# Patient Record
Sex: Male | Born: 1960 | Race: White | Hispanic: No | Marital: Married | Smoking: Former smoker
Health system: Southern US, Community
[De-identification: ages and names within clinical notes are randomized; demographics above are authoritative.]

## PROBLEM LIST (undated history)

## (undated) DIAGNOSIS — I10 Essential (primary) hypertension: Secondary | ICD-10-CM

## (undated) DIAGNOSIS — T7840XA Allergy, unspecified, initial encounter: Secondary | ICD-10-CM

## (undated) HISTORY — DX: Allergy, unspecified, initial encounter: T78.40XA

## (undated) HISTORY — DX: Essential (primary) hypertension: I10

---

## 2010-03-31 ENCOUNTER — Other Ambulatory Visit: Payer: Self-pay | Admitting: Orthopedic Surgery

## 2010-03-31 ENCOUNTER — Ambulatory Visit: Payer: BC Managed Care – PPO | Attending: Orthopedic Surgery | Admitting: Physical Therapy

## 2010-03-31 ENCOUNTER — Ambulatory Visit
Admission: RE | Admit: 2010-03-31 | Discharge: 2010-03-31 | Disposition: A | Discharge status: Home / Self Care | Payer: BC Managed Care – PPO | Source: Ambulatory Visit | Attending: Orthopedic Surgery | Admitting: Orthopedic Surgery

## 2010-03-31 DIAGNOSIS — M25561 Pain in right knee: Secondary | ICD-10-CM

## 2010-03-31 DIAGNOSIS — IMO0001 Reserved for inherently not codable concepts without codable children: Secondary | ICD-10-CM | POA: Insufficient documentation

## 2010-03-31 DIAGNOSIS — M25569 Pain in unspecified knee: Secondary | ICD-10-CM | POA: Insufficient documentation

## 2010-04-07 ENCOUNTER — Ambulatory Visit: Payer: BC Managed Care – PPO | Attending: Orthopedic Surgery | Admitting: Physical Therapy

## 2010-04-07 DIAGNOSIS — IMO0001 Reserved for inherently not codable concepts without codable children: Secondary | ICD-10-CM | POA: Insufficient documentation

## 2010-04-07 DIAGNOSIS — M25569 Pain in unspecified knee: Secondary | ICD-10-CM | POA: Insufficient documentation

## 2012-05-30 LAB — HM COLONOSCOPY

## 2014-08-19 DIAGNOSIS — L309 Dermatitis, unspecified: Secondary | ICD-10-CM | POA: Insufficient documentation

## 2017-05-09 DIAGNOSIS — H34231 Retinal artery branch occlusion, right eye: Secondary | ICD-10-CM | POA: Insufficient documentation

## 2019-12-25 DIAGNOSIS — R42 Dizziness and giddiness: Secondary | ICD-10-CM | POA: Insufficient documentation

## 2020-10-18 ENCOUNTER — Ambulatory Visit (INDEPENDENT_AMBULATORY_CARE_PROVIDER_SITE_OTHER): Payer: Managed Care, Other (non HMO) | Admitting: Sports Medicine

## 2020-10-18 DIAGNOSIS — M25562 Pain in left knee: Secondary | ICD-10-CM | POA: Diagnosis not present

## 2020-10-18 DIAGNOSIS — G8929 Other chronic pain: Secondary | ICD-10-CM | POA: Diagnosis not present

## 2020-10-18 MED ORDER — MELOXICAM 15 MG PO TABS: ORAL_TABLET | ORAL | 3 refills | Status: DC

## 2020-10-18 NOTE — Assessment & Plan Note (Signed)
This is a pleasant 60 year old male, he has a history of a chronic burn over his left leg, for months he has had discomfort in his left knee, anteromedial, mild gelling. 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 Return to see me if no better in 6 weeks and we can add x-rays and potentially injection.

## 2020-10-18 NOTE — Progress Notes (Signed)
    Procedures performed today:    None.  Independent interpretation of notes and tests performed by another provider:   None.  Brief History, Exam, Impression, and Recommendations:    Chronic pain of left knee This is a pleasant 61 year old male, he has a history of a chronic burn over his left leg, for months he has had discomfort in his left knee, anteromedial, mild gelling. 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 Return to see me if no better in 6 weeks and we can add x-rays and potentially injection.  Chronic process with exacerbation and pharmacologic intervention  ___________________________________________ [name], M.D., ABFM., CAQSM. Primary Care and Sports Medicine [facility]  Adjunct Instructor of Family Medicine  [employer]

## 2020-11-09 DIAGNOSIS — I10 Essential (primary) hypertension: Secondary | ICD-10-CM | POA: Insufficient documentation

## 2020-11-25 ENCOUNTER — Ambulatory Visit: Payer: Managed Care, Other (non HMO) | Admitting: Medical-Surgical

## 2020-11-25 ENCOUNTER — Encounter: Payer: Self-pay | Admitting: Medical-Surgical

## 2020-11-25 ENCOUNTER — Other Ambulatory Visit: Payer: Self-pay

## 2020-11-25 VITALS — BP 124/85 | HR 73 | Resp 20 | Ht 69.0 in | Wt 172.0 lb

## 2020-11-25 DIAGNOSIS — F1320 Sedative, hypnotic or anxiolytic dependence, uncomplicated: Secondary | ICD-10-CM

## 2020-11-25 DIAGNOSIS — R42 Dizziness and giddiness: Secondary | ICD-10-CM | POA: Diagnosis not present

## 2020-11-25 DIAGNOSIS — H8109 Meniere's disease, unspecified ear: Secondary | ICD-10-CM | POA: Diagnosis not present

## 2020-11-25 DIAGNOSIS — Z7689 Persons encountering health services in other specified circumstances: Secondary | ICD-10-CM | POA: Diagnosis not present

## 2020-11-25 DIAGNOSIS — F419 Anxiety disorder, unspecified: Secondary | ICD-10-CM | POA: Diagnosis not present

## 2020-11-25 DIAGNOSIS — Z23 Encounter for immunization: Secondary | ICD-10-CM | POA: Diagnosis not present

## 2020-11-25 DIAGNOSIS — I10 Essential (primary) hypertension: Secondary | ICD-10-CM

## 2020-11-25 NOTE — Progress Notes (Signed)
New Patient Office Visit  Subjective:  Patient ID: Scott Hernandez, male    DOB: 1960/01/19  Age: 60 y.o. MRN: [id number]  CC:  Chief Complaint  Patient presents with   Establish Care     HPI Scott Hernandez presents to establish care.  He is a very pleasant 61 year old gentleman who has relocated to [place] from [place] town.  He was previously seen by his PCP in [place] town but his provider has retired and moved out of state.  Today, he presents with no significant concerns.  He is currently being treated for hypertension with lisinopril 5 mg daily.  He checks his blood pressure at home with readings at goal.  He is having no side effects from the medication and has been taking it for approximately 2 months.  He exercises regularly and follows a low-sodium diet.  He does have a long history of having issues with dizziness that was suspected to be vertigo.  He was seen for this 20 to 25 years ago and evaluated by multiple providers with full testing.  Notes that when he goes into big open spaces or is exposed to bright lights, he has difficulty with dizziness onset.  Years ago after all the testing, he was advised to return to his family practitioner and discuss treatment with Valium for vertigo.  Notes that he went in to discuss the treatment with his PCP at the time who placed him on Xanax.  He has been taking Xanax 0.5 mg twice daily scheduled for the past 20 to 25 years and has had no further issues with his symptoms.  Recently, he did have his dosage increased in frequency and was provided an extra 10 tablets to use on a as needed basis.  Notes that he asked for this because some months due to the last 31 days rather than the prescription length of 30.  He is not currently taking extra doses during the day and reports that he ultimately would like to get off the medicine if he does not have to take it.  Past Medical History:  Diagnosis Date   Hypertension 9/22    History reviewed. No  pertinent surgical history.  History reviewed. No pertinent family history.  Social History   Socioeconomic History   Marital status: Married    Spouse name: Not on file   Number of children: Not on file   Years of education: Not on file   Highest education level: Not on file  Occupational History   Not on file  Tobacco Use   Smoking status: Former    Packs/day: 1.00    Years: 10.00    Pack years: 10.00    Types: Cigarettes    Quit date: 11/20/1995    Years since quitting: 25.0   Smokeless tobacco: Never  Vaping Use   Vaping Use: Never used  Substance and Sexual Activity   Alcohol use: Yes    Alcohol/week: 3.0 standard drinks    Types: 3 Cans of beer per week   Drug use: Never   Sexual activity: Yes    Birth control/protection: None  Other Topics Concern   Not on file  Social History Narrative   Not on file   Social Determinants of Health   Financial Resource Strain: Not on file  Food Insecurity: Not on file  Transportation Needs: Not on file  Physical Activity: Not on file  Stress: Not on file  Social Connections: Not on file  Intimate Partner Violence: Not on  file    ROS Review of Systems  Constitutional:  Negative for chills, fatigue, fever and unexpected weight change.  HENT:  Negative for congestion, rhinorrhea, sinus pressure and sore throat.   Eyes:  Negative for visual disturbance.  Respiratory:  Negative for cough, chest tightness, shortness of breath and wheezing.   Cardiovascular:  Negative for chest pain, palpitations and leg swelling.  Gastrointestinal:  Negative for abdominal pain, constipation, diarrhea, nausea and vomiting.  Genitourinary:  Negative for dysuria, frequency and urgency.  Skin:  Negative for rash.  Neurological:  Negative for dizziness, light-headedness and headaches.  Psychiatric/Behavioral:  Negative for dysphoric mood, self-injury, sleep disturbance and suicidal ideas. The patient is not nervous/anxious.    Objective:    Today's Vitals: BP 124/85 (BP Location: Right Arm, Patient Position: Sitting, Cuff Size: Large)   Pulse 73   Resp 20   Ht 5\' 9"  (1.753 m)   Wt 172 lb (78 kg)   SpO2 97%   BMI 25.40 kg/m   Physical Exam Vitals and nursing note reviewed.  Constitutional:      General: He is not in acute distress.    Appearance: Normal appearance. He is not ill-appearing.  HENT:     Head: Normocephalic and atraumatic.  Cardiovascular:     Rate and Rhythm: Normal rate and regular rhythm.     Pulses: Normal pulses.     Heart sounds: Normal heart sounds. No murmur heard.   No friction rub. No gallop.  Pulmonary:     Effort: Pulmonary effort is normal. No respiratory distress.     Breath sounds: Normal breath sounds.  Skin:    General: Skin is warm and dry.  Neurological:     Mental Status: He is alert and oriented to person, place, and time.  Psychiatric:        Mood and Affect: Mood normal.        Behavior: Behavior normal.        Thought Content: Thought content normal.        Judgment: Judgment normal.    Assessment & Plan:   1. Encounter to establish care Reviewed available information and discussed care concerns with patient.   2. Anxiety 3. Vertigo 4. Meniere's disease, unspecified laterality 5. Benzodiazepine dependence (HCC) Continue Xanax 0.5 mg twice daily for now.  We did discuss switching over to a longer acting benzodiazepine such as Valium but for now we will hold off.  We may discuss this again in the future and see how he tolerates switching.  In the meantime, if he would like to trial reducing his dose to one half of a tablet twice daily, I think this would be reasonable in the effort to be able to eventually discontinue the medication.  6. Essential hypertension Continue lisinopril 5 mg daily.  Blood pressure well controlled.  7. Need for shingles vaccine Shingrix No. 1 given in office today.  Second vaccine in series to be given in 2-6 months.  Okay to do this as a  nurse visit or at his next follow-up as desired. - Varicella-zoster vaccine IM (Shingrix)  Outpatient Encounter Medications as of 11/25/2020  Medication Sig   ALPRAZolam (XANAX) 0.5 MG tablet TAKE 1 TABLET BY MOUTH every 8 hours prn   Ascorbic Acid (VITAMIN C PO) Take by mouth daily.   Cyanocobalamin (B-12 PO) Take by mouth daily.   imiquimod (ALDARA) 5 % cream Apply topically.   lisinopril (ZESTRIL) 5 MG tablet Take 5 mg by mouth daily.  Multiple Vitamin (MULTI-VITAMIN) tablet Take 1 tablet by mouth daily.   VITAMIN D PO Take by mouth daily.   [DISCONTINUED] benzonatate (TESSALON) 200 MG capsule Take 200 mg by mouth 3 (three) times daily as needed.   [DISCONTINUED] meloxicam (MOBIC) 15 MG tablet One tab PO qAM with a meal for 2 weeks, then daily prn pain.   No facility-administered encounter medications on file as of 11/25/2020.    Follow-up: Return in about 3 months (around 02/25/2021) for vertigo follow up.   Clearnce Sorrel, DNP, APRN, FNP-BC [facility]

## 2020-12-27 ENCOUNTER — Telehealth: Payer: Self-pay | Admitting: General Practice

## 2020-12-27 NOTE — Telephone Encounter (Signed)
Transition Care Management Follow-up Telephone Call Date of discharge and from where: 12/26/20 from [facility] How have you been since you were released from the hospital? Pain is a little better.  Any questions or concerns? No  Items Reviewed: Did the pt receive and understand the discharge instructions provided? Yes  Medications obtained and verified? No  Other? No  Any new allergies since your discharge? No  Dietary orders reviewed? Yes Do you have support at home? Yes   Home Care and Equipment/Supplies: Were home health services ordered? no  Functional Questionnaire: (I = Independent and D = Dependent) ADLs: I  Bathing/Dressing- I  Meal Prep- I  Eating- I  Maintaining continence- I  Transferring/Ambulation- I  Managing Meds- I  Follow up appointments reviewed:  PCP Hospital f/u appt confirmed? Yes  Scheduled to see Scott Butter, NP on 12/26/20 @ 0810. Specialist Hospital f/u appt confirmed? No   Are transportation arrangements needed? No  If their condition worsens, is the pt aware to call PCP or go to the Emergency Dept.? Yes Was the patient provided with contact information for the PCP's office or ED? Yes Was to pt encouraged to call back with questions or concerns? Yes

## 2021-01-05 ENCOUNTER — Encounter: Payer: Self-pay | Admitting: Medical-Surgical

## 2021-01-05 ENCOUNTER — Other Ambulatory Visit: Payer: Self-pay | Admitting: Medical-Surgical

## 2021-01-05 MED ORDER — ALPRAZOLAM 0.5 MG PO TABS: ORAL_TABLET | ORAL | 2 refills | Status: DC

## 2021-01-27 NOTE — Progress Notes (Signed)
HPI with pertinent ROS:   CC: Hospital discharge follow-up  HPI: Very pleasant 61 year old male presenting today for hospital discharge follow-up after he presented to the ED left-sided neck pain that radiated down the left arm.  He had work-up in the ED which was benign and was determined to have cervical degenerative disc concerns.  The pain lasted for another 2 days and then spontaneously resolved.  Today he presents feeling fine and completely symptom-free.  Has had no numbness, tingling, temperature or color changes to the upper extremities.  No chest pains, shortness of breath, nausea, vomiting, or diaphoresis.  Tolerating regular activities and intense exercise without dyspnea or chest pain.  Has continued to have left knee issues.  He did see Dr. Karie Schwalbe in October regarding the chronic pain in his left knee.  At that time, he was offered to have an x-ray completed but he decided against it.  Now, he has tried conservative therapy and regular exercise but his left knee continues to bother him.  Notes that activity and intense exercise makes it better while inactivity makes it worse.  Does have pain in various areas of the knee when walking uphill or downhill.  Would like to have x-rays done today.  I reviewed the past medical history, family history, social history, surgical history, and allergies today and no changes were needed.  Please see the problem list section below in epic for further details.   Physical exam:   Physical Exam Vitals and nursing note reviewed.  Constitutional:      General: He is not in acute distress.    Appearance: Normal appearance. He is normal weight. He is not ill-appearing.  HENT:     Head: Normocephalic and atraumatic.  Cardiovascular:     Rate and Rhythm: Normal rate and regular rhythm.     Pulses: Normal pulses.     Heart sounds: Normal heart sounds. No murmur heard.   No friction rub. No gallop.  Pulmonary:     Effort: Pulmonary effort is normal. No  respiratory distress.     Breath sounds: Normal breath sounds.  Skin:    General: Skin is warm and dry.  Neurological:     Mental Status: He is alert and oriented to person, place, and time.  Psychiatric:        Mood and Affect: Mood normal.        Behavior: Behavior normal.        Thought Content: Thought content normal.        Judgment: Judgment normal.    Impression and Recommendations:    1. Hospital discharge follow-up Reviewed available information and discussed hospital visit with patient.  Patient education provided on cervical degeneration and measures to take to prevent worsening of his symptoms and to address rehabilitation of the cervical spine.  Printed prescriptions provided for cervical spondylosis with instructions to perform these at home.  Okay to use over-the-counter ibuprofen or other anti-inflammatory as needed.  2. Chronic pain of left knee We will go ahead and get x-rays of the left knee today.  Popping and clicking of the knee is concerning for possible meniscal tear however he has responded beautifully to regular intense activity.  Would recommend continuing to exercise.  Can consider adding an oral anti-inflammatory or using a topical 1 such as Voltaren gel. - DG Knee Complete 4 Views Left; Future  3. Need for shingles vaccine Shingrix No. 2 given in office today.  Series complete. - Varicella-zoster vaccine IM (Shingrix)  Return in about 6 months (around 07/28/2021) for HTN follow up. ___________________________________________ Thayer Ohm, DNP, APRN, FNP-BC Primary Care and Sports Medicine [facility]

## 2021-01-28 ENCOUNTER — Ambulatory Visit: Payer: Managed Care, Other (non HMO) | Admitting: Medical-Surgical

## 2021-01-28 ENCOUNTER — Encounter: Payer: Self-pay | Admitting: Medical-Surgical

## 2021-01-28 ENCOUNTER — Ambulatory Visit (INDEPENDENT_AMBULATORY_CARE_PROVIDER_SITE_OTHER): Payer: Managed Care, Other (non HMO)

## 2021-01-28 ENCOUNTER — Other Ambulatory Visit: Payer: Self-pay

## 2021-01-28 VITALS — BP 113/81 | HR 77 | Resp 20 | Ht 69.0 in | Wt 173.0 lb

## 2021-01-28 DIAGNOSIS — M25562 Pain in left knee: Secondary | ICD-10-CM

## 2021-01-28 DIAGNOSIS — G8929 Other chronic pain: Secondary | ICD-10-CM

## 2021-01-28 DIAGNOSIS — Z23 Encounter for immunization: Secondary | ICD-10-CM | POA: Diagnosis not present

## 2021-01-28 DIAGNOSIS — Z09 Encounter for follow-up examination after completed treatment for conditions other than malignant neoplasm: Secondary | ICD-10-CM

## 2021-02-25 ENCOUNTER — Ambulatory Visit: Payer: Managed Care, Other (non HMO) | Admitting: Medical-Surgical

## 2021-04-07 ENCOUNTER — Other Ambulatory Visit: Payer: Self-pay | Admitting: Medical-Surgical

## 2021-06-15 ENCOUNTER — Encounter: Payer: Self-pay | Admitting: Medical-Surgical

## 2021-06-15 DIAGNOSIS — G8929 Other chronic pain: Secondary | ICD-10-CM

## 2021-06-21 ENCOUNTER — Ambulatory Visit: Payer: Managed Care, Other (non HMO) | Attending: Medical-Surgical | Admitting: Physical Therapy

## 2021-06-21 DIAGNOSIS — R2689 Other abnormalities of gait and mobility: Secondary | ICD-10-CM | POA: Diagnosis present

## 2021-06-21 DIAGNOSIS — M25562 Pain in left knee: Secondary | ICD-10-CM | POA: Insufficient documentation

## 2021-06-21 DIAGNOSIS — G8929 Other chronic pain: Secondary | ICD-10-CM | POA: Insufficient documentation

## 2021-06-21 DIAGNOSIS — M6281 Muscle weakness (generalized): Secondary | ICD-10-CM | POA: Insufficient documentation

## 2021-06-22 NOTE — Patient Instructions (Signed)
Access Code: [email or web address] URL: [email or web address] Date: 06/22/2021 Prepared by: Vernon Prey April Kirstie Peri  Exercises - Clamshell with Resistance  - 1 x daily - 7 x weekly - 3 sets - 10 reps - Sidelying Reverse Clamshell with Resistance  - 1 x daily - 7 x weekly - 3 sets - 10 reps - Forward Step Down  - 1 x daily - 7 x weekly - 3 sets - 10 reps - Forward T Arms Overhead with Band  - 1 x daily - 7 x weekly - 3 sets - 10 reps

## 2021-06-22 NOTE — Therapy (Signed)
[facility] [address] [place], [place], [address] Phone: [phone]   Fax:  [phone]  Physical Therapy Evaluation  Patient Details  Name: Scott BailRicky Hernandez MRN: [id number] Date of Birth: 12/09/1960 Referring Provider (PT): Christen ButterJessup, Joy, NP  Rationale for Evaluation and Treatment Rehabilitation  Encounter Date: 06/21/2021   PT End of Session - 06/21/21 1520     Visit Number 1    Number of Visits 6    Date for PT Re-Evaluation 08/02/21    Authorization Type [employer] Managed    PT Start Time 1520    PT Stop Time 1600    PT Time Calculation (min) 40 min    Activity Tolerance Patient tolerated treatment well    Behavior During Therapy [email or web address] for tasks assessed/performed             Past Medical History:  Diagnosis Date   Hypertension 9/22    No past surgical history on file.  There were no vitals filed for this visit.    Subjective Assessment - 06/21/21 1525     Subjective Pt reports 3 months ago his shoulder started bothering him and his knee also starting to get worked up. Pt states he is waiting for x-ray on his R shoulder and has not discussed with MD. Pt states L knee pain began ~6 months ago but is unable to note a method of injury. Pt reports he would rest and then L knee would feel okay and then flare up again. It is currently improved. Pt states he can feel it when bending the knee. Can feel it come on if pushing it hard on squats. Will mostly feel it in the back of his knee but when he lays on his side he feels it in his medial knee.    How long can you sit comfortably? n/a    How long can you stand comfortably? n/a    How long can you walk comfortably? n/a    Currently in Pain? Yes    Pain Score 1    at worst 4/10   Pain Location Knee    Pain Orientation Left    Pain Type Chronic pain    Pain Onset More than a month ago    Pain Frequency Intermittent                [facility] PT Assessment -  06/22/21 0001       Assessment   Medical Diagnosis M25.562,G89.29 (ICD-10-CM) - Chronic pain of left knee    Referring Provider (PT) Christen ButterJessup, Joy, NP    Onset Date/Surgical Date --   ~6 months ago   Hand Dominance Right    Prior Therapy Yes, for right knee      Precautions   Precautions None      Restrictions   Weight Bearing Restrictions No      Balance Screen   Has the patient fallen in the past 6 months No      Home Environment   Living Environment Private residence    Living Arrangements Spouse/significant other      Prior Function   Vocation Full time employment    Vocation Requirements Mostly desk work    Leisure Works out, jogs      Observation/Other Assessments   Focus on Therapeutic Outcomes (FOTO)  n/a      Functional Tests   Functional tests Single leg stance;Sit to Stand      Single Leg Stance   Comments R  LE: 1 min 30 sec; L LE: 1 min 30 sec      Sit to Stand   Comments SL on L increased instability with medial/lateral shifting vs R      AROM   Right Knee Extension 0    Right Knee Flexion 145    Left Knee Extension 0    Left Knee Flexion 145      Strength   Right Hip Flexion 5/5    Right Hip Extension 5/5    Right Hip External Rotation  5/5    Right Hip Internal Rotation 5/5    Right Hip ABduction 5/5    Right Hip ADduction 5/5    Left Hip Flexion 4+/5    Left Hip Extension 4/5    Left Hip External Rotation 4-/5    Left Hip Internal Rotation 4+/5    Left Hip ABduction 4/5    Left Hip ADduction 5/5    Right Knee Flexion 5/5    Right Knee Extension 5/5    Left Knee Flexion 5/5    Left Knee Extension 5/5                        Objective measurements completed on examination: See above findings.                PT Education - 06/21/21 1607     Education Details Discussed exam findings, POC and HEP    Person(s) Educated Patient    Methods Explanation;Demonstration;Tactile cues;Verbal cues;Handout     Comprehension Verbalized understanding;Returned demonstration;Verbal cues required;Tactile cues required                 PT Long Term Goals - 06/22/21 0717       PT LONG TERM GOAL #1   Title Pt will be independent with HEP    Time 6    Period Weeks    Status New    Target Date 08/03/21      PT LONG TERM GOAL #2   Title Pt will report >/=50% decrease in knee pain for all activities    Time 6    Period Weeks    Status New    Target Date 08/03/21      PT LONG TERM GOAL #3   Title Pt will be able to perform single leg squat without pain    Time 6    Period Weeks    Status New    Target Date 08/03/21      PT LONG TERM GOAL #4   Title Pt will have L=R LE strength    Time 6    Period Weeks    Status New    Target Date 08/03/21                    Plan - 06/21/21 1608     Clinical Impression Statement Mr. Scott Hernandez is a 61 y/o M presenting to OPPT due to ongoing L knee pain x 6 months. Pain has been decreasing overall; however, pt notes that certain movements can cause it to flare up with pain mostly in the posterior and medial knee. PT assessment significant for L>R knee instability with single leg movements. Static balance is [facility] however dynamic single leg motions demo increased medial/lateral shifting. Pain reproduced with knee bent and valgus force (i.e. turning to R sidelying pushing through L foot). Pt would benefit from PT to address these issues for pain free mobility.  Personal Factors and Comorbidities Fitness;Time since onset of injury/illness/exacerbation    Examination-Activity Limitations Squat;Stairs    Examination-Participation Restrictions Community Activity;Yard Work    Stability/Clinical Decision Making Stable/Uncomplicated    Clinical Decision Making Low    Rehab Potential Good    PT Frequency 1x / week    PT Duration 6 weeks    PT Treatment/Interventions ADLs/Self Care Home Management;Cryotherapy;Electrical Stimulation;Iontophoresis  4mg /ml Dexamethasone;Moist Heat;DME Instruction;Gait training;Stair training;Functional mobility training;Therapeutic activities;Therapeutic exercise;Balance training;Neuromuscular re-education;Passive range of motion;Dry needling;Taping    PT Next Visit Plan Assess response to HEP. Continue to work on single leg stability and gross knee/hip strengthening    PT Home Exercise Plan Access Code: [email or web address]    Consulted and Agree with Plan of Care Patient             Patient will benefit from skilled therapeutic intervention in order to improve the following deficits and impairments:  Pain, Hypermobility, Decreased balance, Improper body mechanics, Decreased mobility, Decreased strength  Visit Diagnosis: Chronic pain of left knee  Muscle weakness (generalized)  Other abnormalities of gait and mobility     Problem List Patient Active Problem List   Diagnosis Date Noted   Anxiety 11/25/2020   Need for shingles vaccine 11/25/2020   Essential hypertension 11/09/2020   Vertigo 12/25/2019   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 [place], [place], [place] Phone: [phone]   Fax:  [phone]  Name: Lestat Golob MRN: Scott Hernandez Date of Birth: 11-19-1960

## 2021-06-29 ENCOUNTER — Ambulatory Visit: Payer: Managed Care, Other (non HMO) | Admitting: Physical Therapy

## 2021-07-08 ENCOUNTER — Other Ambulatory Visit: Payer: Self-pay | Admitting: Medical-Surgical

## 2021-07-09 ENCOUNTER — Encounter: Payer: Self-pay | Admitting: Medical-Surgical

## 2021-07-11 NOTE — Telephone Encounter (Signed)
Appointment scheduled for 7/20 so he will need to attend this for any further refills.

## 2021-07-28 ENCOUNTER — Encounter: Payer: Self-pay | Admitting: Medical-Surgical

## 2021-07-28 ENCOUNTER — Ambulatory Visit: Payer: Managed Care, Other (non HMO) | Admitting: Medical-Surgical

## 2021-07-28 VITALS — BP 115/76 | HR 60 | Resp 20 | Ht 69.0 in | Wt 169.1 lb

## 2021-07-28 DIAGNOSIS — F1320 Sedative, hypnotic or anxiolytic dependence, uncomplicated: Secondary | ICD-10-CM

## 2021-07-28 DIAGNOSIS — I10 Essential (primary) hypertension: Secondary | ICD-10-CM | POA: Diagnosis not present

## 2021-07-28 DIAGNOSIS — R053 Chronic cough: Secondary | ICD-10-CM

## 2021-07-28 MED ORDER — ALPRAZOLAM 0.5 MG PO TABS
0.5000 mg | ORAL_TABLET | Freq: Two times a day (BID) | ORAL | 5 refills | Status: DC | PRN
Start: 2021-08-10 — End: 2022-02-02

## 2021-07-28 MED ORDER — LISINOPRIL 5 MG PO TABS: 5.0000 mg | ORAL_TABLET | Freq: Every day | ORAL | 1 refills | Status: DC

## 2021-07-28 NOTE — Progress Notes (Signed)
Established Patient Office Visit  Subjective   Patient ID: Scott Hernandez, male   DOB: 1960-06-21 Age: 61 y.o. MRN: [id number]   Chief Complaint  Patient presents with   Hypertension    HPI Pleasant 61 year old male presenting today for follow up on HTN.  Medication: Lisinopril 5mg  daily Compliant: yes Side effects: none Checking BP at home: yes, readings 110-120/70-80 Low sodium diet: yes Exercise: some, limited by shoulder pain, getting better Concerning symptoms: none Found that certain foods and drinks can affect his BP. Avoiding caffeine and energy drinks.  Requesting a refill on Xanax. Has been treated with this long term. Was started on it for Meniere's disease although he notes that they originally talked about Valium instead. Since then, he has been taking 0.5mg  BID scheduled. Recently, has noted some "brain fog" in the afternoons. Over the past couple of days, he has taken a full tablet in the morning with a half tab in the afternoon and another half tab at bedtime. Reports this has resolved the brain fog and he feels normal. Has not tried to wean off of Xanax or only take it on an as needed basis.   Over the past several weeks, has noted a dry cough that happens once a day, every day. Notes that he will cough 8-9 times then be fine for the rest of the day. This happens at various times of the day and is not associated with particular activities. Does note waking with a bad taste in his mouth some mornings but has no other reflux symptoms. Has tried homeopathic allergy remedies without benefit.    Objective:    Vitals:   07/28/21 1514  BP: 115/76  Pulse: 60  Resp: 20  Height: 5\' 9"  (1.753 m)  Weight: 169 lb 1.9 oz (76.7 kg)  SpO2: 97%  BMI (Calculated): 24.96   Physical Exam Vitals reviewed.  Constitutional:      General: He is not in acute distress.    Appearance: Normal appearance. He is obese. He is not ill-appearing.  HENT:     Head: Normocephalic.   Cardiovascular:     Rate and Rhythm: Normal rate.     Pulses: Normal pulses.     Heart sounds: Normal heart sounds. No murmur heard.    No friction rub. No gallop.  Pulmonary:     Effort: Pulmonary effort is normal. No respiratory distress.     Breath sounds: Normal breath sounds.  Skin:    General: Skin is warm and dry.  Neurological:     Mental Status: He is alert and oriented to person, place, and time.  Psychiatric:        Mood and Affect: Mood normal.        Behavior: Behavior normal.        Thought Content: Thought content normal.        Judgment: Judgment normal.   No results found for this or any previous visit (from the past 24 hour(s)).     The ASCVD Risk score (Arnett DK, et al., 2019) failed to calculate for the following reasons:   Cannot find a previous HDL lab   Assessment & Plan:   1. Essential hypertension BP well controlled. Continue Lisinopril 5mg  daily. Continue to monitor BP at home with a goal of 130/80 or less. Recommend low sodium diet, regular intentional exercise, and maintaining a healthy weight.   2. Benzodiazepine dependence (HCC) Refilling Xanax 0.5mg  BID. Discussed long term treatment and risks associated with dependence/tolerance. Consider  reducing dose to 1/2 tablet TID to see how he responds. Would like him to work on reducing use to the smallest effective dose on a prn basis.   3. Chronic cough Unclear etiology. Consider allergies, LPR, or ACE cough. Recommend a daily antihistamine. Also recommend famotidine 20mg  BID to see if this provides any relief. If these are not helpful, may need to switch to a different BP med. Patient verbalized understanding.   Return in about 4 months (around 11/28/2021) for annual physical exam.  ___________________________________________ 11/30/2021, DNP, APRN, FNP-BC Primary Care and Sports Medicine [facility] [place]

## 2021-08-18 ENCOUNTER — Encounter: Payer: Self-pay | Admitting: Medical-Surgical

## 2021-09-20 ENCOUNTER — Telehealth: Payer: Self-pay | Admitting: Medical-Surgical

## 2021-09-20 NOTE — Telephone Encounter (Signed)
Pt called.  He wants to go ahead with getting the xray of his shoulder.

## 2021-09-27 ENCOUNTER — Ambulatory Visit: Payer: Managed Care, Other (non HMO) | Admitting: Sports Medicine

## 2021-09-27 ENCOUNTER — Ambulatory Visit: Payer: Managed Care, Other (non HMO)

## 2021-09-27 DIAGNOSIS — M75111 Incomplete rotator cuff tear or rupture of right shoulder, not specified as traumatic: Secondary | ICD-10-CM | POA: Insufficient documentation

## 2021-09-27 DIAGNOSIS — G8929 Other chronic pain: Secondary | ICD-10-CM

## 2021-09-27 DIAGNOSIS — M25511 Pain in right shoulder: Secondary | ICD-10-CM | POA: Diagnosis not present

## 2021-09-27 NOTE — Assessment & Plan Note (Signed)
Scott Hernandez is a very pleasant 61 year old male, he has had several months of pain at right shoulder localized anteriorly and over the deltoid, worse with jerking motions. He has done over 2 months of rotator cuff and band type strengthening exercises so I would consider him to have failed physician directed conservative treatment for over 6 weeks. On exam he does have mostly labral signs with a positive O'Brien's test. Considering failure of conservative treatment we will proceed with x-rays and an MR arthrogram, I will see him back for the arthrogram injection.

## 2021-09-27 NOTE — Progress Notes (Signed)
    Procedures performed today:    None.  Independent interpretation of notes and tests performed by another provider:   None.  Brief History, Exam, Impression, and Recommendations:    Chronic right shoulder pain Scott Hernandez is a very pleasant 61 year old male, he has had several months of pain at right shoulder localized anteriorly and over the deltoid, worse with jerking motions. He has done over 2 months of rotator cuff and band type strengthening exercises so I would consider him to have failed physician directed conservative treatment for over 6 weeks. On exam he does have mostly labral signs with a positive O'Brien's test. Considering failure of conservative treatment we will proceed with x-rays and an MR arthrogram, I will see him back for the arthrogram injection.    ____________________________________________ Gwen Her. Dianah Field, M.D., ABFM., CAQSM., AME. Primary Care and Sports Medicine [place] [facility]  Adjunct Professor of [place] [employer] [facility] of Medicine  Risk manager

## 2021-10-17 ENCOUNTER — Ambulatory Visit: Payer: Managed Care, Other (non HMO)

## 2021-10-17 ENCOUNTER — Encounter: Payer: Self-pay | Admitting: Sports Medicine

## 2021-10-17 ENCOUNTER — Ambulatory Visit: Payer: Managed Care, Other (non HMO) | Admitting: Sports Medicine

## 2021-10-17 ENCOUNTER — Ambulatory Visit (INDEPENDENT_AMBULATORY_CARE_PROVIDER_SITE_OTHER): Payer: Managed Care, Other (non HMO)

## 2021-10-17 DIAGNOSIS — G8929 Other chronic pain: Secondary | ICD-10-CM

## 2021-10-17 DIAGNOSIS — M25511 Pain in right shoulder: Secondary | ICD-10-CM

## 2021-10-17 NOTE — Progress Notes (Signed)
    Procedures performed today:    Procedure: Real-time Ultrasound Guided gadolinium contrast injection of right glenohumeral joint Device: Samsung HS60  Verbal informed consent obtained.  Time-out conducted.  Noted no overlying erythema, induration, or other signs of local infection.  Skin prepped in a sterile fashion.  Local anesthesia: Topical Ethyl chloride.  With sterile technique and under real time ultrasound guidance: Noted arthritic joint, 22-gauge spinal needle advanced into the joint from a posterior approach, I injected 1 cc kenalog 40, 2 cc lidocaine, 2 cc bupivacaine, syringe switched and 0.1 cc gadolinium injected, syringe again switched and 10 cc sterile saline used to fully distend the joint. Joint visualized and capsule seen distending confirming intra-articular placement of contrast material and medication. Completed without difficulty  Advised to call if fevers/chills, erythema, induration, drainage, or persistent bleeding.  Images permanently stored in PACS Impression: Technically successful ultrasound guided gadolinium contrast injection for MR arthrography.  Please see separate MR arthrogram report.  Independent interpretation of notes and tests performed by another provider:   None.  Brief History, Exam, Impression, and Recommendations:    Chronic right shoulder pain Please see prior notes, injection for MR arthrography as above.    ____________________________________________ Gwen Her. Dianah Field, M.D., ABFM., CAQSM., AME. Primary Care and Sports Medicine [place] [facility]  Adjunct Professor of [employer] [employer] of Medicine  Risk manager

## 2021-10-17 NOTE — Assessment & Plan Note (Signed)
Please see prior notes, injection for MR arthrography as above.

## 2021-10-23 NOTE — Progress Notes (Unsigned)
   Complete physical exam  Patient: Scott Hernandez   DOB: 1960/10/06   61 y.o. Male  MRN: [id number]  Subjective:    No chief complaint on file.   Sanchez Hemmer is a 61 y.o. male who presents today for a complete physical exam. He reports consuming a {diet types:17450} diet. {types:19826} He generally feels {DESC; WELL/FAIRLY WELL/POORLY:18703}. He reports sleeping {DESC; WELL/FAIRLY WELL/POORLY:18703}. He {does/does not:200015} have additional problems to discuss today.    Most recent fall risk assessment:    07/28/2021    3:17 PM  [place] in the past year? 0  Number falls in past yr: 0  Injury with Fall? 0  Risk for fall due to : No Fall Risks     Most recent depression screenings:    07/28/2021    3:18 PM 11/25/2020    9:41 AM  PHQ 2/9 Scores  PHQ - 2 Score 0 0    {VISON DENTAL STD PSA (Optional):27386}  {History (Optional):23778}  Patient Care Team: Samuel Bouche, NP as PCP - General (Nurse Practitioner)   Outpatient Medications Prior to Visit  Medication Sig   ALPRAZolam (XANAX) 0.5 MG tablet Take 1 tablet (0.5 mg total) by mouth 2 (two) times daily as needed for anxiety.   Ascorbic Acid (VITAMIN C PO) Take by mouth daily.   Cyanocobalamin (B-12 PO) Take by mouth daily.   imiquimod (ALDARA) 5 % cream Apply topically.   lisinopril (ZESTRIL) 5 MG tablet Take 1 tablet (5 mg total) by mouth daily.   Multiple Vitamin (MULTI-VITAMIN) tablet Take 1 tablet by mouth daily.   VITAMIN D PO Take by mouth daily.   No facility-administered medications prior to visit.    ROS        Objective:     There were no vitals taken for this visit. {Vitals History (Optional):23777}  Physical Exam   No results found for any visits on 10/24/21. {Show previous labs (optional):23779}    Assessment & Plan:    Routine Health Maintenance and Physical Exam  Immunization History  Administered Date(s) Administered   Janssen (J&J) SARS-COV-2 Vaccination 05/15/2019    Tdap 11/26/2014   Zoster Recombinat (Shingrix) 11/25/2020, 01/28/2021    Health Maintenance  Topic Date Due   HIV Screening  Never done   Hepatitis C Screening  Never done   INFLUENZA VACCINE  Never done   COLONOSCOPY (Pts 45-80yrs Insurance coverage will need to be confirmed)  05/31/2022   TETANUS/TDAP  11/25/2024   Zoster Vaccines- Shingrix  Completed   HPV VACCINES  Aged Out   COVID-19 Vaccine  Discontinued    Discussed health benefits of physical activity, and encouraged him to engage in regular exercise appropriate for his age and condition.  Problem List Items Addressed This Visit       Cardiovascular and Mediastinum   Essential hypertension - Primary     Other   Anxiety   Other Visit Diagnoses     Annual physical exam       Need for influenza vaccination          No follow-ups on file.     Samuel Bouche, NP

## 2021-10-24 ENCOUNTER — Encounter: Payer: Self-pay | Admitting: Medical-Surgical

## 2021-10-24 ENCOUNTER — Ambulatory Visit (INDEPENDENT_AMBULATORY_CARE_PROVIDER_SITE_OTHER): Payer: Managed Care, Other (non HMO) | Admitting: Medical-Surgical

## 2021-10-24 VITALS — BP 116/79 | HR 71 | Ht 70.0 in | Wt 164.0 lb

## 2021-10-24 DIAGNOSIS — Z0000 Encounter for general adult medical examination without abnormal findings: Secondary | ICD-10-CM | POA: Diagnosis not present

## 2021-10-24 DIAGNOSIS — I10 Essential (primary) hypertension: Secondary | ICD-10-CM | POA: Diagnosis not present

## 2021-10-24 DIAGNOSIS — Z125 Encounter for screening for malignant neoplasm of prostate: Secondary | ICD-10-CM

## 2021-10-24 DIAGNOSIS — F419 Anxiety disorder, unspecified: Secondary | ICD-10-CM | POA: Diagnosis not present

## 2021-10-24 DIAGNOSIS — R5383 Other fatigue: Secondary | ICD-10-CM

## 2021-10-24 DIAGNOSIS — Z23 Encounter for immunization: Secondary | ICD-10-CM

## 2021-10-25 ENCOUNTER — Encounter: Payer: Self-pay | Admitting: Medical-Surgical

## 2021-10-25 DIAGNOSIS — E785 Hyperlipidemia, unspecified: Secondary | ICD-10-CM

## 2021-10-25 LAB — LIPID PANEL
Cholesterol: 215 mg/dL — ABNORMAL HIGH (ref <200)
HDL: 46 mg/dL (ref >=40)
LDL Cholesterol (Calc): 138 mg/dL (calc) — ABNORMAL HIGH
Non-HDL Cholesterol (Calc): 169 mg/dL (calc) — ABNORMAL HIGH (ref <130)
Total CHOL/HDL Ratio: 4.7 (calc) (ref <5.0)
Triglycerides: 168 mg/dL — ABNORMAL HIGH (ref <150)

## 2021-10-25 LAB — CBC WITH DIFFERENTIAL/PLATELET
Absolute Monocytes: 693 cells/uL (ref 200–950)
Basophils Absolute: 27 cells/uL (ref 0–200)
Basophils Relative: 0.3 %
Eosinophils Absolute: 72 cells/uL (ref 15–500)
Eosinophils Relative: 0.8 %
HCT: 52 % — ABNORMAL HIGH (ref 38.5–50.0)
Hemoglobin: 18 g/dL — ABNORMAL HIGH (ref 13.2–17.1)
Lymphs Abs: 1449 cells/uL (ref 850–3900)
MCH: 29 pg (ref 27.0–33.0)
MCHC: 34.6 g/dL (ref 32.0–36.0)
MCV: 83.7 fL (ref 80.0–100.0)
MPV: 10.2 fL (ref 7.5–12.5)
Monocytes Relative: 7.7 %
Neutro Abs: 6759 cells/uL (ref 1500–7800)
Neutrophils Relative %: 75.1 %
Platelets: 286 10*3/uL (ref 140–400)
RBC: 6.21 10*6/uL — ABNORMAL HIGH (ref 4.20–5.80)
RDW: 13.3 % (ref 11.0–15.0)
Total Lymphocyte: 16.1 %
WBC: 9 10*3/uL (ref 3.8–10.8)

## 2021-10-25 LAB — COMPLETE METABOLIC PANEL WITH GFR
AG Ratio: 1.7 (calc) (ref 1.0–2.5)
ALT: 25 U/L (ref 9–46)
AST: 19 U/L (ref 10–35)
Albumin: 4.9 g/dL (ref 3.6–5.1)
Alkaline phosphatase (APISO): 63 U/L (ref 35–144)
BUN/Creatinine Ratio: 31 (calc) — ABNORMAL HIGH (ref 6–22)
BUN: 33 mg/dL — ABNORMAL HIGH (ref 7–25)
CO2: 27 mmol/L (ref 20–32)
Calcium: 9.9 mg/dL (ref 8.6–10.3)
Chloride: 98 mmol/L (ref 98–110)
Creat: 1.08 mg/dL (ref 0.70–1.35)
Globulin: 2.9 g/dL (calc) (ref 1.9–3.7)
Glucose, Bld: 78 mg/dL (ref 65–139)
Potassium: 4.6 mmol/L (ref 3.5–5.3)
Sodium: 133 mmol/L — ABNORMAL LOW (ref 135–146)
Total Bilirubin: 0.7 mg/dL (ref 0.2–1.2)
Total Protein: 7.8 g/dL (ref 6.1–8.1)
eGFR: 79 mL/min/{1.73_m2} (ref >=60)

## 2021-10-25 LAB — TESTOSTERONE TOTAL,FREE,BIO, MALES
Albumin: 4.9 g/dL (ref 3.6–5.1)
Sex Hormone Binding: 49 nmol/L (ref 22–77)
Testosterone, Bioavailable: 84.8 ng/dL — ABNORMAL LOW (ref 110.0–575.0)
Testosterone, Free: 38 pg/mL — ABNORMAL LOW (ref 46.0–224.0)
Testosterone: 414 ng/dL (ref 250–827)

## 2021-10-25 LAB — PSA: PSA: 0.84 ng/mL (ref <=4.00)

## 2021-10-25 MED ORDER — ROSUVASTATIN CALCIUM 5 MG PO TABS: 5.0000 mg | ORAL_TABLET | Freq: Every day | ORAL | 3 refills | Status: DC

## 2021-12-06 ENCOUNTER — Ambulatory Visit: Payer: Managed Care, Other (non HMO) | Admitting: Sports Medicine

## 2021-12-06 DIAGNOSIS — M75111 Incomplete rotator cuff tear or rupture of right shoulder, not specified as traumatic: Secondary | ICD-10-CM

## 2021-12-06 NOTE — Assessment & Plan Note (Signed)
This is a very pleasant 61 year old male, chronic right shoulder pain, impingement in nature, we did a MR arthrography that showed mostly partial-thickness rotator cuff tearing of the supraspinatus, he felt pretty good after the injection and then symptoms came back, he is agreeable to try some physical therapy for 6 to 8 weeks before considering referral for arthroscopic intervention and potential rotator cuff repair.

## 2021-12-06 NOTE — Progress Notes (Signed)
    Procedures performed today:    None.  Independent interpretation of notes and tests performed by another provider:   None.  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    ____________________________________________ [name], M.D., ABFM., CAQSM., AME. Primary Care and Sports Medicine  [facility]  Adjunct Professor of Family Medicine  [employer] [employer] [facility] of Medicine  Restaurant manager, fast food

## 2021-12-14 ENCOUNTER — Encounter: Payer: Self-pay | Admitting: Rehabilitative and Restorative Service Providers"

## 2021-12-14 ENCOUNTER — Other Ambulatory Visit: Payer: Self-pay

## 2021-12-14 ENCOUNTER — Ambulatory Visit
Payer: Managed Care, Other (non HMO) | Attending: Sports Medicine | Admitting: Rehabilitative and Restorative Service Providers"

## 2021-12-14 DIAGNOSIS — M25511 Pain in right shoulder: Secondary | ICD-10-CM | POA: Insufficient documentation

## 2021-12-14 DIAGNOSIS — M75111 Incomplete rotator cuff tear or rupture of right shoulder, not specified as traumatic: Secondary | ICD-10-CM | POA: Insufficient documentation

## 2021-12-14 DIAGNOSIS — R531 Weakness: Secondary | ICD-10-CM | POA: Diagnosis not present

## 2021-12-14 DIAGNOSIS — R293 Abnormal posture: Secondary | ICD-10-CM | POA: Diagnosis not present

## 2021-12-14 DIAGNOSIS — G8929 Other chronic pain: Secondary | ICD-10-CM

## 2021-12-14 DIAGNOSIS — R29898 Other symptoms and signs involving the musculoskeletal system: Secondary | ICD-10-CM

## 2021-12-14 NOTE — Therapy (Signed)
OUTPATIENT PHYSICAL THERAPY SHOULDER EVALUATION   Patient Name: Scott Hernandez MRN: [id number] DOB:December 16, 1960, 61 y.o., male Today's Date: 12/14/2021  END OF SESSION:  PT End of Session - 12/14/21 1552     Visit Number 1    Number of Visits 16    Date for PT Re-Evaluation 02/08/22    PT Start Time 1400    PT Stop Time 1448    PT Time Calculation (min) 48 min    Activity Tolerance Patient tolerated treatment well             Past Medical History:  Diagnosis Date   Hypertension 9/22   History reviewed. No pertinent surgical history. 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   Ganglion cyst 07/28/2011    PCP: Christen Butter, NP  REFERRING PROVIDER: Dr Rodney Langton  REFERRING DIAG: Partial nontraumatic tear Rt RC  THERAPY DIAG:  Chronic right shoulder pain  Abnormal posture  Weakness generalized  Other symptoms and signs involving the musculoskeletal system  Rationale for Evaluation and Treatment: Rehabilitation  ONSET DATE: 12/09/20  SUBJECTIVE:                                                                                                                                                                                      SUBJECTIVE STATEMENT: Patient reports that he has had Rt shoulder pain for the past year with no known injury. He has not had treatment other than doing some exercises that he found on line. Exercises have not helped. MRI showed partial tear of Rt supraspinatus.   PERTINENT HISTORY: Knee pain treated successfully with PT and HEP; HTN  PAIN:  Are you having pain? Yes: NPRS scale: 1/10 Pain location: top of shoulder  Pain description: dull   Aggravating factors: moving; lifting; reaching back; lifting straight up  Relieving factors: rest; OTC meds  PRECAUTIONS: Partial RC tear  WEIGHT BEARING RESTRICTIONS: No  FALLS:  Has patient fallen in last 6 months? 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  Yard work; Architect ;Dietitian; hiking; TEFL teacher; gym workout - mostly lower body and at home. Walking ~ 2 days a week for ~ 30-40 min ~ 3 miles; biking  PLOF: Independent  PATIENT GOALS: avoid surgery; get rid of pain  NEXT MD VISIT:   OBJECTIVE:   DIAGNOSTIC FINDINGS:  MRI Rt shoulder 10/17/21: 1. Moderate tendinosis of the supraspinatus tendon with a high-grade partial-thickness bursal surface tear anteriorly. 2. Mild tendinosis of the infraspinatus tendon. 3. Mild subacromial/subdeltoid bursitis.  PATIENT SURVEYS:  FOTO 60  COGNITION: Overall cognitive status: Within functional limits for tasks assessed     SENSATION: WFL  POSTURE: Patient presents with head forward posture with increased thoracic kyphosis; shoulders rounded and elevated; scapulae abducted and rotated along the thoracic spine; head of the humerus anterior in orientation.   UPPER EXTREMITY ROM:   Active ROM Right eval Left eval  Shoulder flexion 145 pulling, pain 160  Shoulder extension 62 64  Shoulder abduction 161 mild pain 162  Shoulder adduction    Shoulder internal rotation Thumb T8 mild pain Thumb T4  Shoulder external rotation 72 pain 90 shd 90/elbow 90   Elbow flexion    Elbow extension    Wrist flexion    Wrist extension    Wrist ulnar deviation    Wrist radial deviation    Wrist pronation    Wrist supination    (Blank rows = not tested)  UPPER EXTREMITY MMT: Pain with resistive testing in Rt shoulder flexion; abduction; ER; IR  MMT Right eval  Left eval  Shoulder flexion 4/5 5/5  Shoulder extension 5/5 5/5  Shoulder abduction 4-/5  5/5  Shoulder adduction    Shoulder internal rotation 4+/5 5/5  Shoulder external rotation 4-/5 5/5  Middle trapezius    Lower trapezius    Elbow flexion    Elbow extension    Wrist flexion    Wrist extension    Wrist ulnar deviation    Wrist radial deviation    Wrist pronation    Wrist supination    Grip strength (lbs)    (Blank rows = not tested)  PALPATION:  Muscular tightness Rt > Lt pecs; upper trap; leveator; teres; biceps tendon area    TODAY'S TREATMENT:                                                                                                                                         DATE: 12/14/21   PATIENT EDUCATION: Education details: POC; HEP  Person educated: Patient Education method: Programmer, multimedia, Facilities manager, Actor cues, Verbal cues, and Handouts Education comprehension: verbalized understanding, returned demonstration, verbal cues required, tactile cues required, and needs further education  HOME EXERCISE PROGRAM: Access Code: [email or web address] URL: [email or web address] Date: 12/14/2021 Prepared by: Corlis Leak  Exercises - Seated Cervical Retraction  - 3 x daily - 7 x weekly - 1 sets - 10 reps - Standing Scapular Retraction  - 3 x daily - 7 x weekly - 1 sets - 10 reps - 10 hold - Shoulder External Rotation and Scapular Retraction  - 3 x daily -  7 x weekly - 1 sets - 10 reps -   hold - Seated Shoulder W  - 2 x daily - 7 x weekly - 1 sets - 10 reps - 3 sec  hold - Doorway Pec Stretch at 60 Degrees Abduction  - 3 x daily - 7 x weekly - 1 sets - 3 reps - Doorway Pec Stretch at 90 Degrees Abduction  - 3 x daily - 7 x weekly - 1 sets - 3 reps - 30 seconds  hold - Doorway Pec Stretch at 120 Degrees Abduction  - 3 x daily - 7 x weekly - 1 sets - 3 reps - 30 second hold  hold - Standing Pectoral Release with Shoulder Abduction with Ball at Wall  - 2-3 x daily -  7 x weekly - Standing Infraspinatus/Teres Minor Release with Ball at Wall  - 2 x daily - 7 x weekly  Patient Education - Office Posture  ASSESSMENT:  CLINICAL IMPRESSION:a Patient is a 61 y.o. who was seen today for physical therapy evaluation and treatment for Rt shoulder pain and dysfunction. He does not remember any specific injury but reports Rt shoulder pain for the past year. MRI shows partial tear of Rt supraspinatus. He has poor posture and alignment with scapulae abducted and rotated along the thoracic wall; decreased ROM/mobility/strength Rt shoulder and pain with functional activities. Patient will benefit from PT to address problems identified.   OBJECTIVE IMPAIRMENTS: decreased mobility, decreased ROM, decreased strength, increased muscle spasms, impaired flexibility, impaired UE functional use, improper body mechanics, postural dysfunction, and pain.   ACTIVITY LIMITATIONS: carrying, lifting, sleeping, dressing, and reach over head  PARTICIPATION LIMITATIONS: occupation, yard work, and activities requiring shoulder elevation  PERSONAL FACTORS: Past/current experiences, Time since onset of injury/illness/exacerbation, and comorbidity: HTN are also affecting patient's functional outcome.   REHAB POTENTIAL: Good  CLINICAL DECISION MAKING: Stable/uncomplicated  EVALUATION COMPLEXITY: Low   GOALS: Goals reviewed with patient? Yes  SHORT TERM GOALS: Target date: 01/11/2022   Patient independent in initial HEP Baseline: Goal status: INITIAL  2.  Improve thoracic extension and position of scapulae on thoracic wall  Baseline:  Goal status: INITIAL    LONG TERM GOALS: Target date: 02/08/2022   Improve posture and alignment with patient to demonstrate improved upright posture with posterior shoulder girdle engaged. Baseline:  Goal status: INITIAL  2.  Increased AROM and mobility Rt shoulder to =/> than AROM Lt shoulder  Baseline:  Goal status: INITIAL  3.  Pain  free AROM Rt shoulder Baseline:  Goal status: INITIAL  4.  Increase strength Rt shoulder to 5-/5 to 5/5 Baseline:  Goal status: INITIAL  5.  Independent in HEP  Baseline:  Goal status: INITIAL  6.  Improve functional limitation score to 74 Baseline:  Goal status: INITIAL  PLAN:  PT FREQUENCY: 2x/week  PT DURATION: 8 weeks  PLANNED INTERVENTIONS: Therapeutic exercises, Therapeutic activity, Neuromuscular re-education, Patient/Family education, Self Care, Joint mobilization, Aquatic Therapy, Dry Needling, Spinal mobilization, Cryotherapy, Moist heat, Taping, Traction, Ultrasound, Ionotophoresis 4mg /ml Dexamethasone, Manual therapy, and Re-evaluation  PLAN FOR NEXT SESSION: review and progress postural correction and education as well as HEP; manual work and/or DN as indicated; modalities as indicated    Pascale Maves , PT, MPH  12/14/2021, 3:53 PM

## 2021-12-26 ENCOUNTER — Ambulatory Visit: Payer: Managed Care, Other (non HMO) | Admitting: Physical Therapy

## 2021-12-26 ENCOUNTER — Encounter: Payer: Self-pay | Admitting: Physical Therapy

## 2021-12-26 DIAGNOSIS — G8929 Other chronic pain: Secondary | ICD-10-CM

## 2021-12-26 DIAGNOSIS — R293 Abnormal posture: Secondary | ICD-10-CM

## 2021-12-26 DIAGNOSIS — R531 Weakness: Secondary | ICD-10-CM

## 2021-12-26 DIAGNOSIS — M75111 Incomplete rotator cuff tear or rupture of right shoulder, not specified as traumatic: Secondary | ICD-10-CM | POA: Diagnosis not present

## 2021-12-26 DIAGNOSIS — R29898 Other symptoms and signs involving the musculoskeletal system: Secondary | ICD-10-CM

## 2021-12-26 NOTE — Therapy (Signed)
OUTPATIENT PHYSICAL THERAPY SHOULDER EVALUATION   Patient Name: Bryton Keelen MRN: [id number] DOB:1960-01-13, 61 y.o., male Today's Date: 12/26/2021  END OF SESSION:  PT End of Session - 12/26/21 1453     Visit Number 2    Number of Visits 16    Date for PT Re-Evaluation 02/08/22    PT Start Time [id number]    PT Stop Time 1530    PT Time Calculation (min) 37 min    Activity Tolerance Patient tolerated treatment well              Past Medical History:  Diagnosis Date   Hypertension 9/22   History reviewed. No pertinent surgical history. 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   Ganglion cyst 07/28/2011    PCP: Samuel Bouche, NP  REFERRING PROVIDER: Dr Aundria Mems  REFERRING DIAG: Partial nontraumatic tear Rt RC  THERAPY DIAG:  Chronic right shoulder pain  Abnormal posture  Weakness generalized  Other symptoms and signs involving the musculoskeletal system  Rationale for Evaluation and Treatment: Rehabilitation  ONSET DATE: 12/09/20  SUBJECTIVE:                                                                                                                                                                                      SUBJECTIVE STATEMENT: Pt reports no issues with his HEP.   PERTINENT HISTORY: Knee pain treated successfully with PT and HEP; HTN  From eval: Patient reports that he has had Rt shoulder pain for the past year with no known injury. He has not had treatment other than doing some exercises that he found on line. Exercises have not helped. MRI showed partial tear of Rt supraspinatus.   PAIN:  Are you having pain? Yes: NPRS scale: 1/10 Pain  location: top of shoulder  Pain description: dull  Aggravating factors: moving; lifting; reaching back; lifting straight up  Relieving factors: rest; OTC meds  PRECAUTIONS: Partial RC tear  WEIGHT BEARING RESTRICTIONS: No  FALLS:  Has patient fallen in last 6 months? 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  Yard work; Location manager ;Engineering geologist; hiking; Publishing rights manager; gym workout - mostly lower body and at home. Walking ~ 2 days a week for ~ 30-40 min ~ 3 miles; biking   PLOF:  Independent  PATIENT GOALS: avoid surgery; get rid of pain  NEXT MD VISIT:   OBJECTIVE:   DIAGNOSTIC FINDINGS:  MRI Rt shoulder 10/17/21: 1. Moderate tendinosis of the supraspinatus tendon with a high-grade partial-thickness bursal surface tear anteriorly. 2. Mild tendinosis of the infraspinatus tendon. 3. Mild subacromial/subdeltoid bursitis.  PATIENT SURVEYS:  FOTO 60  POSTURE: Patient presents with head forward posture with increased thoracic kyphosis; shoulders rounded and elevated; scapulae abducted and rotated along the thoracic spine; head of the humerus anterior in orientation.   UPPER EXTREMITY ROM:   Active ROM Right eval Left eval  Shoulder flexion 145 pulling, pain 160  Shoulder extension 62 64  Shoulder abduction 161 mild pain 162  Shoulder adduction    Shoulder internal rotation Thumb T8 mild pain Thumb T4  Shoulder external rotation 72 pain 90 shd 90/elbow 90   Elbow flexion    Elbow extension    Wrist flexion    Wrist extension    Wrist ulnar deviation    Wrist radial deviation    Wrist pronation    Wrist supination    (Blank rows = not tested)  UPPER EXTREMITY MMT: Pain with resistive testing in Rt shoulder flexion; abduction; ER; IR  MMT Right eval Left eval  Shoulder flexion 4/5 5/5  Shoulder extension 5/5 5/5  Shoulder abduction 4-/5  5/5  Shoulder adduction    Shoulder internal  rotation 4+/5 5/5  Shoulder external rotation 4-/5 5/5  Middle trapezius    Lower trapezius    Elbow flexion    Elbow extension    Wrist flexion    Wrist extension    Wrist ulnar deviation    Wrist radial deviation    Wrist pronation    Wrist supination    Grip strength (lbs)    (Blank rows = not tested)  PALPATION:  Muscular tightness Rt > Lt pecs; upper trap; leveator; teres; biceps tendon area   [facility] Adult PT Treatment:                                                DATE: 12/26/21 Therapeutic Exercise: Sitting pulleys x 1 min flexion, ER and scaption Doorway pec stretch 2x30 sec 60 deg, 90 deg, 120 deg Standing against pool noodle Shoulder ER green TB 2x10x3 sec W green TB 2x10x3 sec Shoulder iso flex, ext, abd, IR 2x5x5 sec Shoulder abd AAROM with wash cloth 2x10 Shoulder abd setting off wall at 120 deg, 90 deg, 60 deg 5x5 sec    TODAY'S TREATMENT:                                                                                                                                         DATE: 12/14/21   PATIENT EDUCATION: Education details:  POC; HEP  Person educated: Patient Education method: Explanation, Demonstration, Tactile cues, Verbal cues, and Handouts Education comprehension: verbalized understanding, returned demonstration, verbal cues required, tactile cues required, and needs further education  HOME EXERCISE PROGRAM: Access Code: [email or web address] URL: [email or web address] Date: 12/14/2021 Prepared by: Corlis Leak  Exercises - Seated Cervical Retraction  - 3 x daily - 7 x weekly - 1 sets - 10 reps - Standing Scapular Retraction  - 3 x daily - 7 x weekly - 1 sets - 10 reps - 10 hold - Shoulder External Rotation and Scapular Retraction  - 3 x daily - 7 x weekly - 1 sets - 10 reps -   hold - Seated Shoulder W  - 2 x daily - 7 x weekly - 1 sets - 10 reps - 3 sec  hold - Doorway Pec Stretch at 60 Degrees Abduction  - 3 x daily - 7 x weekly - 1 sets - 3  reps - Doorway Pec Stretch at 90 Degrees Abduction  - 3 x daily - 7 x weekly - 1 sets - 3 reps - 30 seconds  hold - Doorway Pec Stretch at 120 Degrees Abduction  - 3 x daily - 7 x weekly - 1 sets - 3 reps - 30 second hold  hold - Standing Pectoral Release with Shoulder Abduction with Ball at Wall  - 2-3 x daily - 7 x weekly - Standing Infraspinatus/Teres Minor Release with Ball at Wall  - 2 x daily - 7 x weekly  Patient Education - Office Posture  ASSESSMENT:  CLINICAL IMPRESSION: Worked on posterior shoulder strengthening and initiating RTC strengthening within tolerable limits. Tolerated treatment well. Focused primarily on isometric strengthening. No pain noted throughout treatment.   OBJECTIVE IMPAIRMENTS: decreased mobility, decreased ROM, decreased strength, increased muscle spasms, impaired flexibility, impaired UE functional use, improper body mechanics, postural dysfunction, and pain.    GOALS: Goals reviewed with patient? Yes  SHORT TERM GOALS: Target date: 01/11/2022   Patient independent in initial HEP Baseline: Goal status: INITIAL  2.  Improve thoracic extension and position of scapulae on thoracic wall  Baseline:  Goal status: INITIAL    LONG TERM GOALS: Target date: 02/08/2022   Improve posture and alignment with patient to demonstrate improved upright posture with posterior shoulder girdle engaged. Baseline:  Goal status: INITIAL  2.  Increased AROM and mobility Rt shoulder to =/> than AROM Lt shoulder  Baseline:  Goal status: INITIAL  3.  Pain free AROM Rt shoulder Baseline:  Goal status: INITIAL  4.  Increase strength Rt shoulder to 5-/5 to 5/5 Baseline:  Goal status: INITIAL  5.  Independent in HEP  Baseline:  Goal status: INITIAL  6.  Improve functional limitation score to 74 Baseline:  Goal status: INITIAL  PLAN:  PT FREQUENCY: 2x/week  PT DURATION: 8 weeks  PLANNED INTERVENTIONS: Therapeutic exercises, Therapeutic activity,  Neuromuscular re-education, Patient/Family education, Self Care, Joint mobilization, Aquatic Therapy, Dry Needling, Spinal mobilization, Cryotherapy, Moist heat, Taping, Traction, Ultrasound, Ionotophoresis 4mg /ml Dexamethasone, Manual therapy, and Re-evaluation  PLAN FOR NEXT SESSION: review and progress postural correction and education as well as HEP; manual work and/or DN as indicated; modalities as indicated    Shayde Gervacio April Ma L Shannell Mikkelsen, PT 12/26/2021, 2:56 PM

## 2021-12-28 ENCOUNTER — Encounter: Payer: Self-pay | Admitting: Rehabilitative and Restorative Service Providers"

## 2021-12-28 ENCOUNTER — Ambulatory Visit: Payer: Managed Care, Other (non HMO) | Admitting: Rehabilitative and Restorative Service Providers"

## 2021-12-28 DIAGNOSIS — R531 Weakness: Secondary | ICD-10-CM

## 2021-12-28 DIAGNOSIS — R293 Abnormal posture: Secondary | ICD-10-CM

## 2021-12-28 DIAGNOSIS — R29898 Other symptoms and signs involving the musculoskeletal system: Secondary | ICD-10-CM

## 2021-12-28 DIAGNOSIS — G8929 Other chronic pain: Secondary | ICD-10-CM

## 2021-12-28 DIAGNOSIS — M75111 Incomplete rotator cuff tear or rupture of right shoulder, not specified as traumatic: Secondary | ICD-10-CM | POA: Diagnosis not present

## 2021-12-28 NOTE — Therapy (Signed)
OUTPATIENT PHYSICAL THERAPY SHOULDER TREATMENT   Patient Name: Scott BailRicky Hernandez MRN: [id number] DOB:12/24/1960, 61 y.o., male Today's Date: 12/28/2021  END OF SESSION:  PT End of Session - 12/28/21 1450     Visit Number 3    Number of Visits 16    Date for PT Re-Evaluation 02/08/22    PT Start Time 1448    PT Stop Time 1530    PT Time Calculation (min) 42 min    Activity Tolerance Patient tolerated treatment well              Past Medical History:  Diagnosis Date   Hypertension 9/22   History reviewed. No pertinent surgical history. 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   Ganglion cyst 07/28/2011    PCP: Christen ButterJoy Jessup, NP  REFERRING PROVIDER: Dr Rodney Langtonhomas Thekkekandam  REFERRING DIAG: Partial nontraumatic tear Rt RC  THERAPY DIAG:  Chronic right shoulder pain  Abnormal posture  Weakness generalized  Other symptoms and signs involving the musculoskeletal system  Rationale for Evaluation and Treatment: Rehabilitation  ONSET DATE: 12/09/20  SUBJECTIVE:                                                                                                                                                                                      SUBJECTIVE STATEMENT: Patient reports that he has no pain in the Rt shoulder unless he moves his arm in certain directions. Pain with reaching up to side and reaching back behind body.  He felt good after exercises last visit.   PERTINENT HISTORY: Knee pain treated successfully with PT and HEP; HTN  From eval: Patient reports that he has had Rt shoulder pain for the past year with no known injury. He has not had treatment other than doing  some exercises that he found on line. Exercises have not helped. MRI showed partial tear of Rt supraspinatus.   PAIN:  Are you having pain? Yes: NPRS scale: 0/10 Pain location: top of shoulder  Pain description: dull  Aggravating factors: moving; lifting; reaching back; lifting straight up  Relieving factors: rest; OTC meds  PRECAUTIONS: Partial RC tear  WEIGHT BEARING RESTRICTIONS: No  FALLS:  Has patient fallen in last 6 months? 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  Yard work;  hunting ;kayak; hiking; subba diving; gym workout - mostly lower body and at home. Walking ~ 2 days a week for ~ 30-40 min ~ 3 miles; biking   PLOF: Independent  PATIENT GOALS: avoid surgery; get rid of pain  NEXT MD VISIT:   OBJECTIVE:   DIAGNOSTIC FINDINGS:  MRI Rt shoulder 10/17/21: 1. Moderate tendinosis of the supraspinatus tendon with a high-grade partial-thickness bursal surface tear anteriorly. 2. Mild tendinosis of the infraspinatus tendon. 3. Mild subacromial/subdeltoid bursitis.  PATIENT SURVEYS:  FOTO 60  POSTURE: Patient presents with head forward posture with increased thoracic kyphosis; shoulders rounded and elevated; scapulae abducted and rotated along the thoracic spine; head of the humerus anterior in orientation.   UPPER EXTREMITY ROM:   Active ROM Right eval Left eval  Shoulder flexion 145 pulling, pain 160  Shoulder extension 62 64  Shoulder abduction 161 mild pain 162  Shoulder adduction    Shoulder internal rotation Thumb T8 mild pain Thumb T4  Shoulder external rotation 72 pain 90 shd 90/elbow 90   Elbow flexion    Elbow extension    Wrist flexion    Wrist extension    Wrist ulnar deviation    Wrist radial deviation    Wrist pronation    Wrist supination    (Blank rows = not tested)  UPPER EXTREMITY MMT: Pain with resistive testing in Rt shoulder flexion; abduction;  ER; IR  MMT Right eval Left eval  Shoulder flexion 4/5 5/5  Shoulder extension 5/5 5/5  Shoulder abduction 4-/5  5/5  Shoulder adduction    Shoulder internal rotation 4+/5 5/5  Shoulder external rotation 4-/5 5/5  Middle trapezius    Lower trapezius    Elbow flexion    Elbow extension    Wrist flexion    Wrist extension    Wrist ulnar deviation    Wrist radial deviation    Wrist pronation    Wrist supination    Grip strength (lbs)    (Blank rows = not tested)  PALPATION:  Muscular tightness Rt > Lt pecs; upper trap; leveator; teres; biceps tendon area   [facility] Adult PT Treatment:                                                DATE:  12/28/21: Therapeutic Exercise: Sitting pulleys x 2 min flexion,  x 2 min scaption; x 1 min horizontal ab/add  Doorway pec stretch 2x30 sec 60 deg, 90 deg, 120 deg Standing against pool noodle Shoulder ER green TB 2x10x3 sec W green TB 2x10x3 sec Shoulder iso flex, ext, abd, IR 2x5x5 sec Shoulder abd AAROM with wash cloth 2x10 Shoulder abd isometric abduction 3 sec x 10 x 2 sets IR isometric blue TB x 10  ER isometric blue TB x 10  High row ~ 45 deg to ER yellow TB x 10 x 2 sets standing  Serratus anterior red TB wall slide 10 x 2  Wall clock red TB Rt/Lt 10 x 2  Shoulder flexion 20 sec x 2 hands on door facing  Horizontal abduction stretch 20 sec x 1    12/26/21 Therapeutic Exercise: Sitting pulleys x 1 min flexion, ER and scaption Doorway pec stretch 2x30 sec 60 deg, 90 deg, 120 deg Standing against pool noodle Shoulder ER green TB 2x10x3 sec W green TB 2x10x3 sec Shoulder iso flex, ext,  abd, IR 2x5x5 sec Shoulder abd AAROM with wash cloth 2x10 Shoulder abd setting off wall at 120 deg, 90 deg, 60 deg 5x5 sec    TODAY'S TREATMENT:                                                                                                                                           PATIENT EDUCATION: Education details: POC; HEP  Person  educated: Patient Education method: Programmer, multimedia, Demonstration, Actor cues, Verbal cues, and Handouts Education comprehension: verbalized understanding, returned demonstration, verbal cues required, tactile cues required, and needs further education  HOME EXERCISE PROGRAM: Access Code: [email or web address] URL: [email or web address] Date: 12/28/2021 Prepared by: Corlis Leak  Exercises - Seated Cervical Retraction  - 3 x daily - 7 x weekly - 1 sets - 10 reps - Standing Scapular Retraction  - 3 x daily - 7 x weekly - 1 sets - 10 reps - 10 hold - Shoulder External Rotation and Scapular Retraction  - 3 x daily - 7 x weekly - 1 sets - 10 reps -   hold - Seated Shoulder W  - 2 x daily - 7 x weekly - 1 sets - 10 reps - 3 sec  hold - Doorway Pec Stretch at 60 Degrees Abduction  - 3 x daily - 7 x weekly - 1 sets - 3 reps - Doorway Pec Stretch at 90 Degrees Abduction  - 3 x daily - 7 x weekly - 1 sets - 3 reps - 30 seconds  hold - Doorway Pec Stretch at 120 Degrees Abduction  - 3 x daily - 7 x weekly - 1 sets - 3 reps - 30 second hold  hold - Standing Pectoral Release with Shoulder Abduction with Ball at Wall  - 2-3 x daily - 7 x weekly - Standing Infraspinatus/Teres Minor Release with Ball at [employer]  - 2 x daily - 7 x weekly - Isometric Shoulder Flexion at Wall  - 1 x daily - 7 x weekly - 3 sets - 10 reps - Standing Isometric Shoulder Internal Rotation at Doorway  - 1 x daily - 7 x weekly - 2 sets - 5 reps - 5 hold - Isometric Shoulder Extension at Wall  - 1 x daily - 7 x weekly - 2 sets - 5 reps - 5 hold - Isometric Shoulder Abduction at Wall  - 1 x daily - 7 x weekly - 2 sets - 5 reps - 5 hold - Standing Shoulder Abduction Wall Slide with Thumb Out  - 1 x daily - 7 x weekly - 2 sets - 10 reps - Shoulder External Rotation Reactive Isometrics  - 1 x daily - 7 x weekly - 1 sets - 5-10 reps - 10-30 sec  hold - Shoulder Internal Rotation Reactive Isometrics  - 2 x daily - 7 x weekly - 1 sets - 10 reps  - 3-5  sec  hold - Shoulder Flexion Serratus Activation with Resistance  - 1 x daily - 7 x weekly - 1 sets - 10 reps - 3-5 sec  hold - Wall Clock with Theraband  - 1 x daily - 7 x weekly - 1 sets - 10 reps - 2-3 sec  hold - Shoulder External Rotation in Abduction with Anchored Resistance  - 1 x daily - 7 x weekly - 1-3 sets - 10 reps - 3 sec  hold Patient Education - Office Posture  ASSESSMENT:  CLINICAL IMPRESSION: Worked on posterior shoulder strengthening and rotator cuff  strengthening with no pain. Tolerated treatment well. No pain noted throughout treatment.   OBJECTIVE IMPAIRMENTS: decreased mobility, decreased ROM, decreased strength, increased muscle spasms, impaired flexibility, impaired UE functional use, improper body mechanics, postural dysfunction, and pain.    GOALS: Goals reviewed with patient? Yes  SHORT TERM GOALS: Target date: 01/11/2022   Patient independent in initial HEP Baseline: Goal status: INITIAL  2.  Improve thoracic extension and position of scapulae on thoracic wall  Baseline:  Goal status: INITIAL    LONG TERM GOALS: Target date: 02/08/2022   Improve posture and alignment with patient to demonstrate improved upright posture with posterior shoulder girdle engaged. Baseline:  Goal status: INITIAL  2.  Increased AROM and mobility Rt shoulder to =/> than AROM Lt shoulder  Baseline:  Goal status: INITIAL  3.  Pain free AROM Rt shoulder Baseline:  Goal status: INITIAL  4.  Increase strength Rt shoulder to 5-/5 to 5/5 Baseline:  Goal status: INITIAL  5.  Independent in HEP  Baseline:  Goal status: INITIAL  6.  Improve functional limitation score to 74 Baseline:  Goal status: INITIAL  PLAN:  PT FREQUENCY: 2x/week  PT DURATION: 8 weeks  PLANNED INTERVENTIONS: Therapeutic exercises, Therapeutic activity, Neuromuscular re-education, Patient/Family education, Self Care, Joint mobilization, Aquatic Therapy, Dry Needling, Spinal  mobilization, Cryotherapy, Moist heat, Taping, Traction, Ultrasound, Ionotophoresis 4mg /ml Dexamethasone, Manual therapy, and Re-evaluation  PLAN FOR NEXT SESSION: review and progress postural correction and education as well as HEP; manual work and/or DN as indicated; modalities as indicated    , PT 12/28/2021, 2:51 PM

## 2022-01-04 ENCOUNTER — Ambulatory Visit: Payer: Managed Care, Other (non HMO) | Admitting: Physical Therapy

## 2022-01-04 ENCOUNTER — Encounter: Payer: Self-pay | Admitting: Physical Therapy

## 2022-01-04 DIAGNOSIS — R531 Weakness: Secondary | ICD-10-CM

## 2022-01-04 DIAGNOSIS — R29898 Other symptoms and signs involving the musculoskeletal system: Secondary | ICD-10-CM

## 2022-01-04 DIAGNOSIS — G8929 Other chronic pain: Secondary | ICD-10-CM

## 2022-01-04 DIAGNOSIS — M75111 Incomplete rotator cuff tear or rupture of right shoulder, not specified as traumatic: Secondary | ICD-10-CM | POA: Diagnosis not present

## 2022-01-04 DIAGNOSIS — R293 Abnormal posture: Secondary | ICD-10-CM

## 2022-01-04 NOTE — Therapy (Signed)
OUTPATIENT PHYSICAL THERAPY SHOULDER TREATMENT   Patient Name: Scott Hernandez MRN: [id number] DOB:[birth year], 61 y.o., male Today's Date: 01/04/2022  END OF SESSION:  PT End of Session - 01/04/22 1447     Visit Number 4    Number of Visits 16    Date for PT Re-Evaluation 02/08/22    PT Start Time 1447    PT Stop Time 1530    PT Time Calculation (min) 43 min    Activity Tolerance Patient tolerated treatment well               Past Medical History:  Diagnosis Date   Hypertension 9/22   History reviewed. No pertinent surgical history. 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   Ganglion cyst 07/28/2011    PCP: Christen Butter, NP  REFERRING PROVIDER: Dr Rodney Langton  REFERRING DIAG: Partial nontraumatic tear Rt RC  THERAPY DIAG:  Chronic right shoulder pain  Abnormal posture  Weakness generalized  Other symptoms and signs involving the musculoskeletal system  Rationale for Evaluation and Treatment: Rehabilitation  ONSET DATE: 12/09/20  SUBJECTIVE:                                                                                                                                                                                      SUBJECTIVE STATEMENT: Pt reports nothing new or different to report. Has continued to do exercises at home. No soreness or pain after prior visit. Feels better after PT for ~2 hours and then the pain comes back.   PERTINENT HISTORY: Knee pain treated successfully with PT and HEP; HTN  From eval: Patient reports that he has had Rt shoulder pain for the past year with no known injury. He has not had treatment other than doing some exercises  that he found on line. Exercises have not helped. MRI showed partial tear of Rt supraspinatus.   PAIN:  Are you having pain? Yes: NPRS scale: 0/10 Pain location: top of shoulder  Pain description: dull  Aggravating factors: moving; lifting; reaching back; lifting straight up  Relieving factors: rest; OTC meds  PRECAUTIONS: Partial RC tear  WEIGHT BEARING RESTRICTIONS: No  FALLS:  Has patient fallen in last 6 months? 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  Yard work; Architect ;Dietitian;  hiking; subba diving; gym workout - mostly lower body and at home. Walking ~ 2 days a week for ~ 30-40 min ~ 3 miles; biking   PLOF: Independent  PATIENT GOALS: avoid surgery; get rid of pain  NEXT MD VISIT:   OBJECTIVE:   DIAGNOSTIC FINDINGS:  MRI Rt shoulder 10/17/21: 1. Moderate tendinosis of the supraspinatus tendon with a high-grade partial-thickness bursal surface tear anteriorly. 2. Mild tendinosis of the infraspinatus tendon. 3. Mild subacromial/subdeltoid bursitis.  PATIENT SURVEYS:  FOTO 60  POSTURE: Patient presents with head forward posture with increased thoracic kyphosis; shoulders rounded and elevated; scapulae abducted and rotated along the thoracic spine; head of the humerus anterior in orientation.   UPPER EXTREMITY ROM:   Active ROM Right eval Left eval  Shoulder flexion 145 pulling, pain 160  Shoulder extension 62 64  Shoulder abduction 161 mild pain 162  Shoulder adduction    Shoulder internal rotation Thumb T8 mild pain Thumb T4  Shoulder external rotation 72 pain 90 shd 90/elbow 90   Elbow flexion    Elbow extension    Wrist flexion    Wrist extension    Wrist ulnar deviation    Wrist radial deviation    Wrist pronation    Wrist supination    (Blank rows = not tested)  UPPER EXTREMITY MMT: Pain with resistive testing in Rt shoulder flexion; abduction; ER; IR  MMT  Right eval Left eval  Shoulder flexion 4/5 5/5  Shoulder extension 5/5 5/5  Shoulder abduction 4-/5  5/5  Shoulder adduction    Shoulder internal rotation 4+/5 5/5  Shoulder external rotation 4-/5 5/5  Middle trapezius    Lower trapezius    Elbow flexion    Elbow extension    Wrist flexion    Wrist extension    Wrist ulnar deviation    Wrist radial deviation    Wrist pronation    Wrist supination    Grip strength (lbs)    (Blank rows = not tested)  PALPATION:  Muscular tightness Rt > Lt pecs; upper trap; leveator; teres; biceps tendon area   [facility] Adult PT Treatment:                                                DATE: 01/04/22 Therapeutic Exercise: Sitting pulleys x 2 min flexion,  x 2 min scaption; x 1 min horizontal ab/add  Supine Shoulder abd thumb up AROM 2x10 with scapular retraction Shoulder abd thumb side ways for deltoid 2x10 with scap retraction Shoulder ER at 45 deg shoulder abd AROM 2x10, with yellow TB 2x10 Shoulder IR at 45 deg shoulder abd AROM 2x10, with yellow TB 2x10 Shoulder ER at 90 deg shoulder abd AROM 2x10 Shoulder abd yellow TB 2x10 with scap retraction Shoulder ER green TB 2x10 "W" green TB 2x10 Shoulder horizontal abd green TB 2x10 PNF 2 diagonals green TB 2x10  [facility] Adult PT Treatment:                                                DATE:  12/28/21: Therapeutic Exercise: Sitting pulleys x 2 min flexion,  x 2 min scaption; x 1 min horizontal ab/add  Doorway pec stretch 2x30 sec 60 deg, 90  deg, 120 deg Standing against pool noodle Shoulder ER green TB 2x10x3 sec W green TB 2x10x3 sec Shoulder iso flex, ext, abd, IR 2x5x5 sec Shoulder abd AAROM with wash cloth 2x10 Shoulder abd isometric abduction 3 sec x 10 x 2 sets IR isometric blue TB x 10  ER isometric blue TB x 10  High row ~ 45 deg to ER yellow TB x 10 x 2 sets standing  Serratus anterior red TB wall slide 10 x 2  Wall clock red TB Rt/Lt 10 x 2  Shoulder flexion 20 sec x 2 hands on  door facing  Horizontal abduction stretch 20 sec x 1    PATIENT EDUCATION: Education details: POC; HEP  Person educated: Patient Education method: Programmer, multimedia, Demonstration, Actor cues, Verbal cues, and Handouts Education comprehension: verbalized understanding, returned demonstration, verbal cues required, tactile cues required, and needs further education  HOME EXERCISE PROGRAM: Access Code: [email or web address] URL: [email or web address] Date: 12/28/2021 Prepared by: Corlis Leak  Exercises - Seated Cervical Retraction  - 3 x daily - 7 x weekly - 1 sets - 10 reps - Standing Scapular Retraction  - 3 x daily - 7 x weekly - 1 sets - 10 reps - 10 hold - Shoulder External Rotation and Scapular Retraction  - 3 x daily - 7 x weekly - 1 sets - 10 reps -   hold - Seated Shoulder W  - 2 x daily - 7 x weekly - 1 sets - 10 reps - 3 sec  hold - Doorway Pec Stretch at 60 Degrees Abduction  - 3 x daily - 7 x weekly - 1 sets - 3 reps - Doorway Pec Stretch at 90 Degrees Abduction  - 3 x daily - 7 x weekly - 1 sets - 3 reps - 30 seconds  hold - Doorway Pec Stretch at 120 Degrees Abduction  - 3 x daily - 7 x weekly - 1 sets - 3 reps - 30 second hold  hold - Standing Pectoral Release with Shoulder Abduction with Ball at Wall  - 2-3 x daily - 7 x weekly - Standing Infraspinatus/Teres Minor Release with Ball at [employer]  - 2 x daily - 7 x weekly - Isometric Shoulder Flexion at Wall  - 1 x daily - 7 x weekly - 3 sets - 10 reps - Standing Isometric Shoulder Internal Rotation at Doorway  - 1 x daily - 7 x weekly - 2 sets - 5 reps - 5 hold - Isometric Shoulder Extension at Wall  - 1 x daily - 7 x weekly - 2 sets - 5 reps - 5 hold - Isometric Shoulder Abduction at Wall  - 1 x daily - 7 x weekly - 2 sets - 5 reps - 5 hold - Standing Shoulder Abduction Wall Slide with Thumb Out  - 1 x daily - 7 x weekly - 2 sets - 10 reps - Shoulder External Rotation Reactive Isometrics  - 1 x daily - 7 x weekly - 1 sets - 5-10  reps - 10-30 sec  hold - Shoulder Internal Rotation Reactive Isometrics  - 2 x daily - 7 x weekly - 1 sets - 10 reps - 3-5 sec  hold - Shoulder Flexion Serratus Activation with Resistance  - 1 x daily - 7 x weekly - 1 sets - 10 reps - 3-5 sec  hold - Wall Clock with Theraband  - 1 x daily - 7 x weekly - 1 sets - 10 reps - 2-3 sec  hold - Shoulder External Rotation in Abduction with Anchored Resistance  - 1 x daily - 7 x weekly - 1-3 sets - 10 reps - 3 sec  hold Patient Education - Office Posture  ASSESSMENT:  CLINICAL IMPRESSION: Session focused on initiating gentle shoulder AROM in pain free range. Some mild pain noted with shoulder abd at around 60 deg, improved after increasing reps and cueing for scapular stability. Able to initiate RTC with yellow TB in pain free motions.   OBJECTIVE IMPAIRMENTS: decreased mobility, decreased ROM, decreased strength, increased muscle spasms, impaired flexibility, impaired UE functional use, improper body mechanics, postural dysfunction, and pain.    GOALS: Goals reviewed with patient? Yes  SHORT TERM GOALS: Target date: 01/11/2022   Patient independent in initial HEP Baseline: Goal status: INITIAL  2.  Improve thoracic extension and position of scapulae on thoracic wall  Baseline:  Goal status: INITIAL    LONG TERM GOALS: Target date: 02/08/2022   Improve posture and alignment with patient to demonstrate improved upright posture with posterior shoulder girdle engaged. Baseline:  Goal status: INITIAL  2.  Increased AROM and mobility Rt shoulder to =/> than AROM Lt shoulder  Baseline:  Goal status: INITIAL  3.  Pain free AROM Rt shoulder Baseline:  Goal status: INITIAL  4.  Increase strength Rt shoulder to 5-/5 to 5/5 Baseline:  Goal status: INITIAL  5.  Independent in HEP  Baseline:  Goal status: INITIAL  6.  Improve functional limitation score to 74 Baseline:  Goal status: INITIAL  PLAN:  PT FREQUENCY: 2x/week  PT  DURATION: 8 weeks  PLANNED INTERVENTIONS: Therapeutic exercises, Therapeutic activity, Neuromuscular re-education, Patient/Family education, Self Care, Joint mobilization, Aquatic Therapy, Dry Needling, Spinal mobilization, Cryotherapy, Moist heat, Taping, Traction, Ultrasound, Ionotophoresis 4mg /ml Dexamethasone, Manual therapy, and Re-evaluation  PLAN FOR NEXT SESSION: review and progress postural correction and education as well as HEP; manual work and/or DN as indicated; modalities as indicated    Brianne Maina April Ma L Ellora Varnum, PT 01/04/2022, 2:47 PM

## 2022-01-06 ENCOUNTER — Encounter: Payer: Self-pay | Admitting: Physical Therapy

## 2022-01-06 ENCOUNTER — Ambulatory Visit: Payer: Managed Care, Other (non HMO) | Admitting: Physical Therapy

## 2022-01-06 DIAGNOSIS — R531 Weakness: Secondary | ICD-10-CM

## 2022-01-06 DIAGNOSIS — R29898 Other symptoms and signs involving the musculoskeletal system: Secondary | ICD-10-CM

## 2022-01-06 DIAGNOSIS — M75111 Incomplete rotator cuff tear or rupture of right shoulder, not specified as traumatic: Secondary | ICD-10-CM | POA: Diagnosis not present

## 2022-01-06 DIAGNOSIS — R293 Abnormal posture: Secondary | ICD-10-CM

## 2022-01-06 DIAGNOSIS — G8929 Other chronic pain: Secondary | ICD-10-CM

## 2022-01-06 NOTE — Therapy (Signed)
OUTPATIENT PHYSICAL THERAPY SHOULDER TREATMENT   Patient Name: Scott Hernandez MRN: [id number] DOB:1960/11/29, 61 y.o., male Today's Date: 01/06/2022  END OF SESSION:  PT End of Session - 01/06/22 0801     Visit Number 5    Number of Visits 16    Date for PT Re-Evaluation 02/08/22    PT Start Time 0801    PT Stop Time 0845    PT Time Calculation (min) 44 min    Activity Tolerance Patient tolerated treatment well    Behavior During Therapy [facility] for tasks assessed/performed               Past Medical History:  Diagnosis Date   Hypertension 9/22   History reviewed. No pertinent surgical history. 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   Ganglion cyst 07/28/2011    PCP: Christen Butter, NP  REFERRING PROVIDER: Dr Rodney Langton  REFERRING DIAG: Partial nontraumatic tear Rt RC  THERAPY DIAG:  Chronic right shoulder pain  Abnormal posture  Weakness generalized  Other symptoms and signs involving the musculoskeletal system  Rationale for Evaluation and Treatment: Rehabilitation  ONSET DATE: 12/09/20  SUBJECTIVE:                                                                                                                                                                                      SUBJECTIVE STATEMENT: Pt states he went home that night after PT session and worked on exercises a little bit more. No pain or soreness but he could tell he worked it. Rested the shoulder yesterday. No pain currently.   PERTINENT HISTORY: Knee pain treated successfully with PT and HEP; HTN  From eval: Patient reports that he has had Rt shoulder pain for the past year with no  known injury. He has not had treatment other than doing some exercises that he found on line. Exercises have not helped. MRI showed partial tear of Rt supraspinatus.   PAIN:  Are you having pain? Yes: NPRS scale: 0/10 Pain location: top of shoulder  Pain description: dull  Aggravating factors: moving; lifting; reaching back; lifting straight up  Relieving factors: rest; OTC meds  PRECAUTIONS: Partial RC tear  WEIGHT BEARING RESTRICTIONS: No  FALLS:  Has patient fallen in last 6 months? 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  Yard work; Architect ;Dietitian; hiking; TEFL teacher; gym workout - mostly lower body and at home. Walking ~ 2 days a week for ~ 30-40 min ~ 3 miles; biking   PLOF: Independent  PATIENT GOALS: avoid surgery; get rid of pain  NEXT MD VISIT:   OBJECTIVE:   DIAGNOSTIC FINDINGS:  MRI Rt shoulder 10/17/21: 1. Moderate tendinosis of the supraspinatus tendon with a high-grade partial-thickness bursal surface tear anteriorly. 2. Mild tendinosis of the infraspinatus tendon. 3. Mild subacromial/subdeltoid bursitis.  PATIENT SURVEYS:  FOTO 60  POSTURE: Patient presents with head forward posture with increased thoracic kyphosis; shoulders rounded and elevated; scapulae abducted and rotated along the thoracic spine; head of the humerus anterior in orientation.   UPPER EXTREMITY ROM:   Active ROM Right eval Left eval  Shoulder flexion 145 pulling, pain 160  Shoulder extension 62 64  Shoulder abduction 161 mild pain 162  Shoulder adduction    Shoulder internal rotation Thumb T8 mild pain Thumb T4  Shoulder external rotation 72 pain 90 shd 90/elbow 90   Elbow flexion    Elbow extension    Wrist flexion    Wrist extension    Wrist ulnar deviation    Wrist radial deviation    Wrist pronation    Wrist supination    (Blank rows = not tested)  UPPER EXTREMITY MMT: Pain  with resistive testing in Rt shoulder flexion; abduction; ER; IR  MMT Right eval Left eval  Shoulder flexion 4/5 5/5  Shoulder extension 5/5 5/5  Shoulder abduction 4-/5  5/5  Shoulder adduction    Shoulder internal rotation 4+/5 5/5  Shoulder external rotation 4-/5 5/5  Middle trapezius    Lower trapezius    Elbow flexion    Elbow extension    Wrist flexion    Wrist extension    Wrist ulnar deviation    Wrist radial deviation    Wrist pronation    Wrist supination    Grip strength (lbs)    (Blank rows = not tested)  PALPATION:  Muscular tightness Rt > Lt pecs; upper trap; leveator; teres; biceps tendon area   [facility] Adult PT Treatment:                                                DATE: 01/06/22 Therapeutic Exercise: Sitting pulleys x 2 min flexion,  x 2 min scaption; x 1 min horizontal ab/add Supine Shoulder flexion AROM x 5, red TB 2x10 Shoulder abd AROM x10, yellow TB x5, red TB x10 Shoulder ER at 45 deg and then 90 deg shoulder abd red TB 2x10 Shoulder IR at 45 deg and then 90 deg shoulder abd red TB 2x10 Serratus punch no weight 2x10 Standing Push up plus on wall 2x10 Shoulder horizontal abd green TB 2x10 Low trap setting red TB 2x10   [facility] Adult PT Treatment:                                                DATE: 01/04/22 Therapeutic Exercise: Sitting pulleys x 2 min flexion,  x 2 min scaption; x 1 min horizontal ab/add  Supine Shoulder abd thumb up AROM 2x10 with scapular retraction Shoulder abd  thumb side ways for deltoid 2x10 with scap retraction Shoulder ER at 45 deg shoulder abd AROM 2x10, with yellow TB 2x10 Shoulder IR at 45 deg shoulder abd AROM 2x10, with yellow TB 2x10 Shoulder ER at 90 deg shoulder abd AROM 2x10 Shoulder abd yellow TB 2x10 with scap retraction Shoulder ER green TB 2x10 "W" green TB 2x10 Shoulder horizontal abd green TB 2x10 PNF 2 diagonals green TB 2x10  [facility] Adult PT Treatment:                                                 DATE: 12/28/21: Therapeutic Exercise: Sitting pulleys x 2 min flexion,  x 2 min scaption; x 1 min horizontal ab/add  Doorway pec stretch 2x30 sec 60 deg, 90 deg, 120 deg Standing against pool noodle Shoulder ER green TB 2x10x3 sec W green TB 2x10x3 sec Shoulder iso flex, ext, abd, IR 2x5x5 sec Shoulder abd AAROM with wash cloth 2x10 Shoulder abd isometric abduction 3 sec x 10 x 2 sets IR isometric blue TB x 10  ER isometric blue TB x 10  High row ~ 45 deg to ER yellow TB x 10 x 2 sets standing  Serratus anterior red TB wall slide 10 x 2  Wall clock red TB Rt/Lt 10 x 2  Shoulder flexion 20 sec x 2 hands on door facing  Horizontal abduction stretch 20 sec x 1    PATIENT EDUCATION: Education details: Discussed progressions for home -- I.e. using red and green band when tolerated Person educated: Patient Education method: Explanation, Demonstration, Tactile cues, Verbal cues, and Handouts Education comprehension: verbalized understanding, returned demonstration, verbal cues required, tactile cues required, and needs further education  HOME EXERCISE PROGRAM: Access Code: [email or web address] URL: [email or web address] Date: 12/28/2021 Prepared by: Corlis Leakelyn Holt  Exercises - Seated Cervical Retraction  - 3 x daily - 7 x weekly - 1 sets - 10 reps - Standing Scapular Retraction  - 3 x daily - 7 x weekly - 1 sets - 10 reps - 10 hold - Shoulder External Rotation and Scapular Retraction  - 3 x daily - 7 x weekly - 1 sets - 10 reps -   hold - Seated Shoulder W  - 2 x daily - 7 x weekly - 1 sets - 10 reps - 3 sec  hold - Doorway Pec Stretch at 60 Degrees Abduction  - 3 x daily - 7 x weekly - 1 sets - 3 reps - Doorway Pec Stretch at 90 Degrees Abduction  - 3 x daily - 7 x weekly - 1 sets - 3 reps - 30 seconds  hold - Doorway Pec Stretch at 120 Degrees Abduction  - 3 x daily - 7 x weekly - 1 sets - 3 reps - 30 second hold  hold - Standing Pectoral Release with Shoulder Abduction with Ball at  Wall  - 2-3 x daily - 7 x weekly - Standing Infraspinatus/Teres Minor Release with Ball at [employer]  - 2 x daily - 7 x weekly - Isometric Shoulder Flexion at Wall  - 1 x daily - 7 x weekly - 3 sets - 10 reps - Standing Isometric Shoulder Internal Rotation at Doorway  - 1 x daily - 7 x weekly - 2 sets - 5 reps - 5 hold - Isometric Shoulder Extension at Wall  - 1 x daily -  7 x weekly - 2 sets - 5 reps - 5 hold - Isometric Shoulder Abduction at Wall  - 1 x daily - 7 x weekly - 2 sets - 5 reps - 5 hold - Standing Shoulder Abduction Wall Slide with Thumb Out  - 1 x daily - 7 x weekly - 2 sets - 10 reps - Shoulder External Rotation Reactive Isometrics  - 1 x daily - 7 x weekly - 1 sets - 5-10 reps - 10-30 sec  hold - Shoulder Internal Rotation Reactive Isometrics  - 2 x daily - 7 x weekly - 1 sets - 10 reps - 3-5 sec  hold - Shoulder Flexion Serratus Activation with Resistance  - 1 x daily - 7 x weekly - 1 sets - 10 reps - 3-5 sec  hold - Wall Clock with Theraband  - 1 x daily - 7 x weekly - 1 sets - 10 reps - 2-3 sec  hold - Shoulder External Rotation in Abduction with Anchored Resistance  - 1 x daily - 7 x weekly - 1-3 sets - 10 reps - 3 sec  hold Patient Education - Office Posture  ASSESSMENT:  CLINICAL IMPRESSION: Pt able to tolerate supine RTC strengthening with red TB this session with no pain. Continued to progress pt as tolerated. Pt is demonstrating good strength gains.   OBJECTIVE IMPAIRMENTS: decreased mobility, decreased ROM, decreased strength, increased muscle spasms, impaired flexibility, impaired UE functional use, improper body mechanics, postural dysfunction, and pain.    GOALS: Goals reviewed with patient? Yes  SHORT TERM GOALS: Target date: 01/11/2022   Patient independent in initial HEP Baseline: Goal status: INITIAL  2.  Improve thoracic extension and position of scapulae on thoracic wall  Baseline:  Goal status: INITIAL    LONG TERM GOALS: Target date:  02/08/2022   Improve posture and alignment with patient to demonstrate improved upright posture with posterior shoulder girdle engaged. Baseline:  Goal status: INITIAL  2.  Increased AROM and mobility Rt shoulder to =/> than AROM Lt shoulder  Baseline:  Goal status: INITIAL  3.  Pain free AROM Rt shoulder Baseline:  Goal status: INITIAL  4.  Increase strength Rt shoulder to 5-/5 to 5/5 Baseline:  Goal status: INITIAL  5.  Independent in HEP  Baseline:  Goal status: INITIAL  6.  Improve functional limitation score to 74 Baseline:  Goal status: INITIAL  PLAN:  PT FREQUENCY: 2x/week  PT DURATION: 8 weeks  PLANNED INTERVENTIONS: Therapeutic exercises, Therapeutic activity, Neuromuscular re-education, Patient/Family education, Self Care, Joint mobilization, Aquatic Therapy, Dry Needling, Spinal mobilization, Cryotherapy, Moist heat, Taping, Traction, Ultrasound, Ionotophoresis 4mg /ml Dexamethasone, Manual therapy, and Re-evaluation  PLAN FOR NEXT SESSION: review and progress postural correction and education as well as HEP; manual work and/or DN as indicated; modalities as indicated    Masson Nalepa April Ma L Draiden Mirsky, PT 01/06/2022, 8:01 AM

## 2022-01-11 ENCOUNTER — Encounter: Payer: Self-pay | Admitting: Rehabilitative and Restorative Service Providers"

## 2022-01-11 ENCOUNTER — Ambulatory Visit
Payer: Managed Care, Other (non HMO) | Attending: Sports Medicine | Admitting: Rehabilitative and Restorative Service Providers"

## 2022-01-11 DIAGNOSIS — M6281 Muscle weakness (generalized): Secondary | ICD-10-CM | POA: Insufficient documentation

## 2022-01-11 DIAGNOSIS — M25511 Pain in right shoulder: Secondary | ICD-10-CM | POA: Insufficient documentation

## 2022-01-11 DIAGNOSIS — R293 Abnormal posture: Secondary | ICD-10-CM

## 2022-01-11 DIAGNOSIS — R531 Weakness: Secondary | ICD-10-CM | POA: Diagnosis present

## 2022-01-11 DIAGNOSIS — M25562 Pain in left knee: Secondary | ICD-10-CM | POA: Insufficient documentation

## 2022-01-11 DIAGNOSIS — R29898 Other symptoms and signs involving the musculoskeletal system: Secondary | ICD-10-CM

## 2022-01-11 DIAGNOSIS — G8929 Other chronic pain: Secondary | ICD-10-CM | POA: Diagnosis present

## 2022-01-11 NOTE — Therapy (Signed)
OUTPATIENT PHYSICAL THERAPY SHOULDER TREATMENT   Patient Name: Scott Hernandez MRN: [id number] DOB:07-15-1960, 62 y.o., male Today's Date: 01/11/2022  END OF SESSION:  PT End of Session - 01/11/22 1446     Visit Number 6    Number of Visits 16    Date for PT Re-Evaluation 02/08/22    PT Start Time 8182    PT Stop Time 1528    PT Time Calculation (min) 43 min    Activity Tolerance Patient tolerated treatment well               Past Medical History:  Diagnosis Date   Hypertension 9/22   History reviewed. No pertinent surgical history. 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   Ganglion cyst 07/28/2011    PCP: Samuel Bouche, NP  REFERRING PROVIDER: Dr Aundria Mems  REFERRING DIAG: Partial nontraumatic tear Rt RC  THERAPY DIAG:  Chronic right shoulder pain  Abnormal posture  Weakness generalized  Other symptoms and signs involving the musculoskeletal system  Rationale for Evaluation and Treatment: Rehabilitation  ONSET DATE: 12/09/20  SUBJECTIVE:                                                                                                                                                                                      SUBJECTIVE STATEMENT: Pt states he continues to work on exercises from PT and some he has found on line. He will have decreased pain in the Rt shoulder for 2-3 hours after he works out but then the aching pain returns. He has gained strength and can tell he can lift his arm higher but is still not where he wants to be. MD said to give PT and strengthening 6-8 weeks session return to schedule surgery if he is not better. Patient has not given up  wants to continue working on shoulder strength and function. Same aching persists.   PERTINENT HISTORY: Knee pain treated successfully with PT and HEP; HTN  From eval: Patient reports that he has had Rt shoulder pain for the past year with no known injury. He has not had treatment other than doing some exercises that he found on line. Exercises have not helped. MRI showed partial tear of Rt supraspinatus.   PAIN:  Are you having pain? Yes: NPRS scale: 0/10 Pain location: top of shoulder  Pain description: dull  Aggravating factors: moving; lifting; reaching back; lifting straight up  Relieving factors: rest; OTC meds  PRECAUTIONS: Partial RC tear  WEIGHT BEARING RESTRICTIONS: No  FALLS:  Has patient fallen in last 6 months? 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  Yard work; Location manager ;Engineering geologist; hiking; Publishing rights manager; gym workout - mostly lower body and at home. Walking ~ 2 days a week for ~ 30-40 min ~ 3 miles; biking   PLOF: Independent  PATIENT GOALS: avoid surgery; get rid of pain  NEXT MD VISIT:   OBJECTIVE:   DIAGNOSTIC FINDINGS:  MRI Rt shoulder 10/17/21: 1. Moderate tendinosis of the supraspinatus tendon with a high-grade partial-thickness bursal surface tear anteriorly. 2. Mild tendinosis of the infraspinatus tendon. 3. Mild subacromial/subdeltoid bursitis.  PATIENT SURVEYS:  FOTO 60  POSTURE: Patient presents with head forward posture with increased thoracic kyphosis; shoulders rounded and elevated; scapulae abducted and rotated along the thoracic spine; head of the humerus anterior in orientation.   UPPER EXTREMITY ROM:   Active ROM Right eval Left eval  Shoulder flexion 145 pulling, pain 160  Shoulder extension 62 64  Shoulder abduction 161 mild pain 162  Shoulder adduction    Shoulder internal rotation Thumb T8 mild pain Thumb T4  Shoulder external rotation 72 pain 90  shd 90/elbow 90   Elbow flexion    Elbow extension    Wrist flexion    Wrist extension    Wrist ulnar deviation    Wrist radial deviation    Wrist pronation    Wrist supination    (Blank rows = not tested)  UPPER EXTREMITY MMT: Pain with resistive testing in Rt shoulder flexion; abduction; ER; IR  MMT Right eval Left eval  Shoulder flexion 4/5 5/5  Shoulder extension 5/5 5/5  Shoulder abduction 4-/5  5/5  Shoulder adduction    Shoulder internal rotation 4+/5 5/5  Shoulder external rotation 4-/5 5/5  Middle trapezius    Lower trapezius    Elbow flexion    Elbow extension    Wrist flexion    Wrist extension    Wrist ulnar deviation    Wrist radial deviation    Wrist pronation    Wrist supination    Grip strength (lbs)    (Blank rows = not tested)  PALPATION:  Muscular tightness Rt > Lt pecs; upper trap; leveator; teres; biceps tendon area   [facility] Adult PT Treatment:                                                DATE: 01/11/22 Therapeutic Exercise: UBE L5 x 4 min alternating fwd/back standing  Lat stretch with patient overpressure supine 30 sec x 2; PT overpressure at lateral scapula 30 sec x 2  Biceps/anterior chest stretch at wall shd in ~ 80 deg horizontal abduction 30 sec x 2  Doorway stretch ball in Rt hand to increase stretch for Rt pecs  Sleeper stretch Rt sidelying noodle distal arm to position humerus in glenoid fossa - shoulder/elbow 90 deg flexion IR assist with Lt UE 20-30 sec x 3   Manual Therapy: Deep tissue work Rt anterior shoulder with PROM/stretch into end range flexion pt supine Skilled palpation to assess response for manual work and dry needling  Trigger Point Dry-Needling  Treatment instructions:  Expect mild to moderate muscle soreness. S/S of pneumothorax if dry needled over a lung field, and to seek immediate medical attention should they occur. Patient verbalized understanding of these instructions and education.  Patient Consent Given:  Yes Education handout provided: Yes Muscles treated: Rt pec; biceps tendon; supraspinatus; infraspinatus  Electrical stimulation performed: No Parameters: N/A Treatment response/outcome: decreased palpable tightness; pt reported some soreness   Neuromuscular re-ed: Working on posture and alignment with movement     [facility] Adult PT Treatment:                                                DATE: 01/06/22 Therapeutic Exercise: Sitting pulleys x 2 min flexion,  x 2 min scaption; x 1 min horizontal ab/add Supine Shoulder flexion AROM x 5, red TB 2x10 Shoulder abd AROM x10, yellow TB x5, red TB x10 Shoulder ER at 45 deg and then 90 deg shoulder abd red TB 2x10 Shoulder IR at 45 deg and then 90 deg shoulder abd red TB 2x10 Serratus punch no weight 2x10 Standing Push up plus on wall 2x10 Shoulder horizontal abd green TB 2x10 Low trap setting red TB 2x10   PATIENT EDUCATION: Education details: Discussed progressions for home -- I.e. using red and green band when tolerated Person educated: Patient Education method: Explanation, Demonstration, Tactile cues, Verbal cues, and Handouts Education comprehension: verbalized understanding, returned demonstration, verbal cues required, tactile cues required, and needs further education  HOME EXERCISE PROGRAM: Access Code: [email or web address] URL: [email or web address] Date: 12/28/2021 Prepared by: Gillermo Murdoch  Exercises - Seated Cervical Retraction  - 3 x daily - 7 x weekly - 1 sets - 10 reps - Standing Scapular Retraction  - 3 x daily - 7 x weekly - 1 sets - 10 reps - 10 hold - Shoulder External Rotation and Scapular Retraction  - 3 x daily - 7 x weekly - 1 sets - 10 reps -   hold - Seated Shoulder W  - 2 x daily - 7 x weekly - 1 sets - 10 reps - 3 sec  hold - Doorway Pec Stretch at 60 Degrees Abduction  - 3 x daily - 7 x weekly - 1 sets - 3 reps - Doorway Pec Stretch at 90 Degrees Abduction  - 3 x daily - 7 x weekly - 1 sets - 3 reps - 30  seconds  hold - Doorway Pec Stretch at 120 Degrees Abduction  - 3 x daily - 7 x weekly - 1 sets - 3 reps - 30 second hold  hold - Standing Pectoral Release with Shoulder Abduction with Ball at Wall  - 2-3 x daily - 7 x weekly - Standing Infraspinatus/Teres Minor Release with Ball at [employer]  - 2 x daily - 7 x weekly - Isometric Shoulder Flexion at Wall  - 1 x daily - 7 x weekly - 3 sets - 10 reps - Standing Isometric Shoulder Internal Rotation at Doorway  - 1 x daily - 7 x weekly - 2 sets - 5 reps - 5 hold - Isometric Shoulder Extension at Wall  - 1 x daily - 7 x weekly - 2 sets - 5 reps - 5 hold - Isometric Shoulder Abduction at Wall  - 1 x daily - 7 x weekly - 2 sets - 5 reps - 5 hold - Standing Shoulder Abduction Wall Slide  with Thumb Out  - 1 x daily - 7 x weekly - 2 sets - 10 reps - Shoulder External Rotation Reactive Isometrics  - 1 x daily - 7 x weekly - 1 sets - 5-10 reps - 10-30 sec  hold - Shoulder Internal Rotation Reactive Isometrics  - 2 x daily - 7 x weekly - 1 sets - 10 reps - 3-5 sec  hold - Shoulder Flexion Serratus Activation with Resistance  - 1 x daily - 7 x weekly - 1 sets - 10 reps - 3-5 sec  hold - Wall Clock with Theraband  - 1 x daily - 7 x weekly - 1 sets - 10 reps - 2-3 sec  hold - Shoulder External Rotation in Abduction with Anchored Resistance  - 1 x daily - 7 x weekly - 1-3 sets - 10 reps - 3 sec  hold Patient Education - Office Posture  ASSESSMENT:  CLINICAL IMPRESSION: Continued aching Rt shoulder with limitations in functional activity level. Patient has palpable tightness Rt pecs, biceps, and to lesser extent in posterior shoulder at supraspinatus and infraspinatus. Trial of manual work and dry needling to these muscle groups. Added end range stretches. Some soreness reported following treatment. Will assess benefit from DN and stretches and progress accordingly.   OBJECTIVE IMPAIRMENTS: decreased mobility, decreased ROM, decreased strength, increased muscle  spasms, impaired flexibility, impaired UE functional use, improper body mechanics, postural dysfunction, and pain.    GOALS: Goals reviewed with patient? Yes  SHORT TERM GOALS: Target date: 01/11/2022   Patient independent in initial HEP Baseline: Goal status: INITIAL  2.  Improve thoracic extension and position of scapulae on thoracic wall  Baseline:  Goal status: INITIAL    LONG TERM GOALS: Target date: 02/08/2022   Improve posture and alignment with patient to demonstrate improved upright posture with posterior shoulder girdle engaged. Baseline:  Goal status: INITIAL  2.  Increased AROM and mobility Rt shoulder to =/> than AROM Lt shoulder  Baseline:  Goal status: INITIAL  3.  Pain free AROM Rt shoulder Baseline:  Goal status: INITIAL  4.  Increase strength Rt shoulder to 5-/5 to 5/5 Baseline:  Goal status: INITIAL  5.  Independent in HEP  Baseline:  Goal status: INITIAL  6.  Improve functional limitation score to 74 Baseline:  Goal status: INITIAL  PLAN:  PT FREQUENCY: 2x/week  PT DURATION: 8 weeks  PLANNED INTERVENTIONS: Therapeutic exercises, Therapeutic activity, Neuromuscular re-education, Patient/Family education, Self Care, Joint mobilization, Aquatic Therapy, Dry Needling, Spinal mobilization, Cryotherapy, Moist heat, Taping, Traction, Ultrasound, Ionotophoresis 4mg /ml Dexamethasone, Manual therapy, and Re-evaluation  PLAN FOR NEXT SESSION: review and progress postural correction and education as well as HEP; manual work and/or DN as indicated; modalities as indicated  Trial of manual work with end range stretching of shoulder with scapula stabilization for ROM    [employer], PT 01/11/2022, 4:03 PM

## 2022-01-13 ENCOUNTER — Encounter: Payer: Self-pay | Admitting: Physical Therapy

## 2022-01-13 ENCOUNTER — Ambulatory Visit: Payer: Managed Care, Other (non HMO) | Admitting: Physical Therapy

## 2022-01-13 DIAGNOSIS — R29898 Other symptoms and signs involving the musculoskeletal system: Secondary | ICD-10-CM

## 2022-01-13 DIAGNOSIS — R293 Abnormal posture: Secondary | ICD-10-CM

## 2022-01-13 DIAGNOSIS — G8929 Other chronic pain: Secondary | ICD-10-CM

## 2022-01-13 DIAGNOSIS — R531 Weakness: Secondary | ICD-10-CM

## 2022-01-13 DIAGNOSIS — M25511 Pain in right shoulder: Secondary | ICD-10-CM | POA: Diagnosis not present

## 2022-01-13 NOTE — Therapy (Signed)
OUTPATIENT PHYSICAL THERAPY SHOULDER TREATMENT   Patient Name: Scott Hernandez MRN: [id number] DOB:Sep 23, 1960, 62 y.o., male Today's Date: 01/13/2022  END OF SESSION:  PT End of Session - 01/13/22 0804     Visit Number 7    Number of Visits 16    Date for PT Re-Evaluation 02/08/22    PT Start Time 0804    PT Stop Time 0845    PT Time Calculation (min) 41 min    Activity Tolerance Patient tolerated treatment well    Behavior During Therapy [facility] for tasks assessed/performed               Past Medical History:  Diagnosis Date   Hypertension 9/22   History reviewed. No pertinent surgical history. 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   Ganglion cyst 07/28/2011    PCP: Samuel Bouche, NP  REFERRING PROVIDER: Dr Aundria Mems  REFERRING DIAG: Partial nontraumatic tear Rt RC  THERAPY DIAG:  Chronic right shoulder pain  Abnormal posture  Weakness generalized  Other symptoms and signs involving the musculoskeletal system  Rationale for Evaluation and Treatment: Rehabilitation  ONSET DATE: 12/09/20  SUBJECTIVE:                                                                                                                                                                                      SUBJECTIVE STATEMENT: Pt states after TPDN it felt the best the day after it has been in a year. Pt has continued to do exercises. States front of shoulder feels that it has remained loose but back of shoulder feels it may be tightening up. Has deferred TPDN for today.   PERTINENT HISTORY: Knee pain treated successfully with PT and HEP; HTN  From eval: Patient reports that he has  had Rt shoulder pain for the past year with no known injury. He has not had treatment other than doing some exercises that he found on line. Exercises have not helped. MRI showed partial tear of Rt supraspinatus.   PAIN:  Are you having pain? Yes: NPRS scale: 0/10 Pain location: top of shoulder  Pain description: dull  Aggravating factors: moving; lifting; reaching back; lifting straight up  Relieving factors: rest; OTC meds  PRECAUTIONS: Partial RC tear  WEIGHT BEARING RESTRICTIONS: No  FALLS:  Has patient fallen in last 6 months? 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  Yard work; Architect ;Dietitian; hiking; TEFL teacher; gym workout - mostly lower body and at home. Walking ~ 2 days a week for ~ 30-40 min ~ 3 miles; biking   PLOF: Independent  PATIENT GOALS: avoid surgery; get rid of pain  NEXT MD VISIT:   OBJECTIVE:   DIAGNOSTIC FINDINGS:  MRI Rt shoulder 10/17/21: 1. Moderate tendinosis of the supraspinatus tendon with a high-grade partial-thickness bursal surface tear anteriorly. 2. Mild tendinosis of the infraspinatus tendon. 3. Mild subacromial/subdeltoid bursitis.  PATIENT SURVEYS:  FOTO 60  POSTURE: Patient presents with head forward posture with increased thoracic kyphosis; shoulders rounded and elevated; scapulae abducted and rotated along the thoracic spine; head of the humerus anterior in orientation.   UPPER EXTREMITY ROM:   Active ROM Right eval Left eval  Shoulder flexion 145 pulling, pain 160  Shoulder extension 62 64  Shoulder abduction 161 mild pain 162  Shoulder adduction    Shoulder internal rotation Thumb T8 mild pain Thumb T4  Shoulder external rotation 72 pain 90 shd 90/elbow 90   Elbow flexion    Elbow extension    Wrist flexion    Wrist extension    Wrist ulnar deviation    Wrist radial deviation    Wrist pronation    Wrist supination    (Blank rows  = not tested)  UPPER EXTREMITY MMT: Pain with resistive testing in Rt shoulder flexion; abduction; ER; IR  MMT Right eval Left eval  Shoulder flexion 4/5 5/5  Shoulder extension 5/5 5/5  Shoulder abduction 4-/5  5/5  Shoulder adduction    Shoulder internal rotation 4+/5 5/5  Shoulder external rotation 4-/5 5/5  Middle trapezius    Lower trapezius    Elbow flexion    Elbow extension    Wrist flexion    Wrist extension    Wrist ulnar deviation    Wrist radial deviation    Wrist pronation    Wrist supination    Grip strength (lbs)    (Blank rows = not tested)  PALPATION:  Muscular tightness Rt > Lt pecs; upper trap; leveator; teres; biceps tendon area   [facility] Adult PT Treatment:                                                DATE: 01/13/22 Therapeutic Exercise: UBE L5 x 4 min alternating fwd/back standing  Doorway pec stretch 2x30 sec low, mid, high Sleeper stretch Rt sidelying noodle distal arm to position humerus in glenoid fossa - shoulder/elbow 90 deg flexion IR assist with Lt UE 20-30 sec x 3  Sidelying Shoulder abd 10x5 sec hold at end range for stretch Shoulder abd yellow TB x10 Shoulder ER 3# x10 Bow and arrow green TB 2x10 cues to focus on scapular positioning Shoulder flexion x10 Shoulder flexion 3# x10 Mid deltoid raises to 90 deg shoulder abd 3# 2x10 Manual Therapy: Lat stretching Neuromuscular Re-ed: Tactile cueing for scapular movement with all sidelying movements Self Care: Self massage with theracane posterior shoulder Self massage with ball posterior shoulder, mid back and pec   [facility] Adult PT Treatment:  DATE: 01/11/22 Therapeutic Exercise: UBE L5 x 4 min alternating fwd/back standing  Lat stretch with patient overpressure supine 30 sec x 2; PT overpressure at lateral scapula 30 sec x 2  Biceps/anterior chest stretch at wall shd in ~ 80 deg horizontal abduction 30 sec x 2  Doorway stretch ball in Rt hand to  increase stretch for Rt pecs  Sleeper stretch Rt sidelying noodle distal arm to position humerus in glenoid fossa - shoulder/elbow 90 deg flexion IR assist with Lt UE 20-30 sec x 3   Manual Therapy: Deep tissue work Rt anterior shoulder with PROM/stretch into end range flexion pt supine Skilled palpation to assess response for manual work and dry needling  Trigger Point Dry-Needling  Treatment instructions: Expect mild to moderate muscle soreness. S/S of pneumothorax if dry needled over a lung field, and to seek immediate medical attention should they occur. Patient verbalized understanding of these instructions and education.  Patient Consent Given: Yes Education handout provided: Yes Muscles treated: Rt pec; biceps tendon; supraspinatus; infraspinatus  Electrical stimulation performed: No Parameters: N/A Treatment response/outcome: decreased palpable tightness; pt reported some soreness   Neuromuscular re-ed: Working on posture and alignment with movement     [facility] Adult PT Treatment:                                                DATE: 01/06/22 Therapeutic Exercise: Sitting pulleys x 2 min flexion,  x 2 min scaption; x 1 min horizontal ab/add Supine Shoulder flexion AROM x 5, red TB 2x10 Shoulder abd AROM x10, yellow TB x5, red TB x10 Shoulder ER at 45 deg and then 90 deg shoulder abd red TB 2x10 Shoulder IR at 45 deg and then 90 deg shoulder abd red TB 2x10 Serratus punch no weight 2x10 Standing Push up plus on wall 2x10 Shoulder horizontal abd green TB 2x10 Low trap setting red TB 2x10   PATIENT EDUCATION: Education details: Discussed progressions for home -- I.e. using red and green band when tolerated Person educated: Patient Education method: Explanation, Demonstration, Tactile cues, Verbal cues, and Handouts Education comprehension: verbalized understanding, returned demonstration, verbal cues required, tactile cues required, and needs further education  HOME EXERCISE  PROGRAM: Access Code: [email or web address] URL: [email or web address] Date: 12/28/2021 Prepared by: Gillermo Murdoch  Exercises - Seated Cervical Retraction  - 3 x daily - 7 x weekly - 1 sets - 10 reps - Standing Scapular Retraction  - 3 x daily - 7 x weekly - 1 sets - 10 reps - 10 hold - Shoulder External Rotation and Scapular Retraction  - 3 x daily - 7 x weekly - 1 sets - 10 reps -   hold - Seated Shoulder W  - 2 x daily - 7 x weekly - 1 sets - 10 reps - 3 sec  hold - Doorway Pec Stretch at 60 Degrees Abduction  - 3 x daily - 7 x weekly - 1 sets - 3 reps - Doorway Pec Stretch at 90 Degrees Abduction  - 3 x daily - 7 x weekly - 1 sets - 3 reps - 30 seconds  hold - Doorway Pec Stretch at 120 Degrees Abduction  - 3 x daily - 7 x weekly - 1 sets - 3 reps - 30 second hold  hold - Standing Pectoral Release with Shoulder Abduction with Ball at [employer]  -  2-3 x daily - 7 x weekly - Standing Infraspinatus/Teres Minor Release with Ball at [employer]  - 2 x daily - 7 x weekly - Isometric Shoulder Flexion at Wall  - 1 x daily - 7 x weekly - 3 sets - 10 reps - Standing Isometric Shoulder Internal Rotation at Doorway  - 1 x daily - 7 x weekly - 2 sets - 5 reps - 5 hold - Isometric Shoulder Extension at Wall  - 1 x daily - 7 x weekly - 2 sets - 5 reps - 5 hold - Isometric Shoulder Abduction at Wall  - 1 x daily - 7 x weekly - 2 sets - 5 reps - 5 hold - Standing Shoulder Abduction Wall Slide with Thumb Out  - 1 x daily - 7 x weekly - 2 sets - 10 reps - Shoulder External Rotation Reactive Isometrics  - 1 x daily - 7 x weekly - 1 sets - 5-10 reps - 10-30 sec  hold - Shoulder Internal Rotation Reactive Isometrics  - 2 x daily - 7 x weekly - 1 sets - 10 reps - 3-5 sec  hold - Shoulder Flexion Serratus Activation with Resistance  - 1 x daily - 7 x weekly - 1 sets - 10 reps - 3-5 sec  hold - Wall Clock with Theraband  - 1 x daily - 7 x weekly - 1 sets - 10 reps - 2-3 sec  hold - Shoulder External Rotation in Abduction with  Anchored Resistance  - 1 x daily - 7 x weekly - 1-3 sets - 10 reps - 3 sec  hold Patient Education - Office Posture  ASSESSMENT:  CLINICAL IMPRESSION: Pt reports reduced tightness and aching. Defers TPDN today. Discussed continuing self care tasks to maintain effects of TPDN. Continued to work on Print production planner.   OBJECTIVE IMPAIRMENTS: decreased mobility, decreased ROM, decreased strength, increased muscle spasms, impaired flexibility, impaired UE functional use, improper body mechanics, postural dysfunction, and pain.    GOALS: Goals reviewed with patient? Yes  SHORT TERM GOALS: Target date: 01/11/2022   Patient independent in initial HEP Baseline: Goal status: INITIAL  2.  Improve thoracic extension and position of scapulae on thoracic wall  Baseline:  Goal status: INITIAL    LONG TERM GOALS: Target date: 02/08/2022   Improve posture and alignment with patient to demonstrate improved upright posture with posterior shoulder girdle engaged. Baseline:  Goal status: INITIAL  2.  Increased AROM and mobility Rt shoulder to =/> than AROM Lt shoulder  Baseline:  Goal status: INITIAL  3.  Pain free AROM Rt shoulder Baseline:  Goal status: INITIAL  4.  Increase strength Rt shoulder to 5-/5 to 5/5 Baseline:  Goal status: INITIAL  5.  Independent in HEP  Baseline:  Goal status: INITIAL  6.  Improve functional limitation score to 74 Baseline:  Goal status: INITIAL  PLAN:  PT FREQUENCY: 2x/week  PT DURATION: 8 weeks  PLANNED INTERVENTIONS: Therapeutic exercises, Therapeutic activity, Neuromuscular re-education, Patient/Family education, Self Care, Joint mobilization, Aquatic Therapy, Dry Needling, Spinal mobilization, Cryotherapy, Moist heat, Taping, Traction, Ultrasound, Ionotophoresis 4mg /ml Dexamethasone, Manual therapy, and Re-evaluation  PLAN FOR NEXT SESSION: review and progress postural correction and education as well as HEP; manual work and/or DN as indicated;  modalities as indicated  Trial of manual work with end range stretching of shoulder with scapula stabilization for ROM    Chloey Ricard April Ma L Juniper Snyders, PT 01/13/2022, 8:04 AM

## 2022-01-17 ENCOUNTER — Encounter: Payer: Self-pay | Admitting: Rehabilitative and Restorative Service Providers"

## 2022-01-17 ENCOUNTER — Ambulatory Visit: Payer: Managed Care, Other (non HMO) | Admitting: Rehabilitative and Restorative Service Providers"

## 2022-01-17 DIAGNOSIS — M6281 Muscle weakness (generalized): Secondary | ICD-10-CM

## 2022-01-17 DIAGNOSIS — R293 Abnormal posture: Secondary | ICD-10-CM

## 2022-01-17 DIAGNOSIS — M25511 Pain in right shoulder: Secondary | ICD-10-CM | POA: Diagnosis not present

## 2022-01-17 DIAGNOSIS — R531 Weakness: Secondary | ICD-10-CM

## 2022-01-17 DIAGNOSIS — R29898 Other symptoms and signs involving the musculoskeletal system: Secondary | ICD-10-CM

## 2022-01-17 DIAGNOSIS — G8929 Other chronic pain: Secondary | ICD-10-CM

## 2022-01-17 NOTE — Therapy (Signed)
OUTPATIENT PHYSICAL THERAPY SHOULDER TREATMENT   Patient Name: Scott Hernandez MRN: [id number] DOB:November 19, 1960, 62 y.o., male Today's Date: 01/17/2022  END OF SESSION:  PT End of Session - 01/17/22 1453     Visit Number 8    Number of Visits 16    Date for PT Re-Evaluation 02/08/22    PT Start Time 4403    PT Stop Time 1530    PT Time Calculation (min) 45 min    Activity Tolerance Patient tolerated treatment well               Past Medical History:  Diagnosis Date   Hypertension 9/22   History reviewed. No pertinent surgical history. 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   Ganglion cyst 07/28/2011    PCP: Samuel Bouche, NP  REFERRING PROVIDER: Dr Aundria Mems  REFERRING DIAG: Partial nontraumatic tear Rt RC  THERAPY DIAG:  Chronic right shoulder pain  Abnormal posture  Weakness generalized  Other symptoms and signs involving the musculoskeletal system  Chronic pain of left knee  Muscle weakness (generalized)  Rationale for Evaluation and Treatment: Rehabilitation  ONSET DATE: 12/09/20  SUBJECTIVE:                                                                                                                                                                                      SUBJECTIVE STATEMENT: Patient reports that his shoulder has been feeling a lot better since last week. He feels the dry needling loosened things up a lot No pain and improved mobility now.   PERTINENT HISTORY: Knee pain treated successfully with PT and HEP; HTN  From eval: Patient reports that he has had Rt shoulder pain for the past year with no known injury. He has not had  treatment other than doing some exercises that he found on line. Exercises have not helped. MRI showed partial tear of Rt supraspinatus.   PAIN:  Are you having pain? Yes: NPRS scale: 0/10 Pain location: top of shoulder  Pain description: dull  Aggravating factors: moving; lifting; reaching back; lifting straight up  Relieving factors: rest; OTC meds  PRECAUTIONS: Partial RC tear  WEIGHT BEARING RESTRICTIONS: No  FALLS:  Has patient fallen in last 6 months? 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  Yard work; Architect ;Dietitian; hiking; TEFL teacher; gym workout - mostly lower body and at home. Walking ~ 2 days a week for ~ 30-40 min ~ 3 miles; biking   PATIENT GOALS: avoid surgery; get rid of pain  NEXT MD VISIT:   OBJECTIVE:   DIAGNOSTIC FINDINGS:  MRI Rt shoulder 10/17/21: 1. Moderate tendinosis of the supraspinatus tendon with a high-grade partial-thickness bursal surface tear anteriorly. 2. Mild tendinosis of the infraspinatus tendon. 3. Mild subacromial/subdeltoid bursitis.  PATIENT SURVEYS:  FOTO 60  POSTURE: Patient presents with head forward posture with increased thoracic kyphosis; shoulders rounded and elevated; scapulae abducted and rotated along the thoracic spine; head of the humerus anterior in orientation.   UPPER EXTREMITY ROM:   Active ROM Right eval Left eval  Shoulder flexion 145 pulling, pain 160  Shoulder extension 62 64  Shoulder abduction 161 mild pain 162  Shoulder adduction    Shoulder internal rotation Thumb T8 mild pain Thumb T4  Shoulder external rotation 72 pain 90 shd 90/elbow 90   Elbow flexion    Elbow extension    Wrist flexion    Wrist extension    Wrist ulnar deviation    Wrist radial deviation    Wrist pronation    Wrist supination    (Blank rows = not tested)  UPPER EXTREMITY MMT: Pain with resistive testing in Rt shoulder flexion;  abduction; ER; IR  MMT Right eval Left eval  Shoulder flexion 4/5 5/5  Shoulder extension 5/5 5/5  Shoulder abduction 4-/5  5/5  Shoulder adduction    Shoulder internal rotation 4+/5 5/5  Shoulder external rotation 4-/5 5/5  Middle trapezius    Lower trapezius    Elbow flexion    Elbow extension    Wrist flexion    Wrist extension    Wrist ulnar deviation    Wrist radial deviation    Wrist pronation    Wrist supination    Grip strength (lbs)    (Blank rows = not tested)  PALPATION:  Muscular tightness Rt > Lt pecs; upper trap; leveator; teres; biceps tendon area   [facility] Adult PT Treatment:                                                DATE: 01/17/22 Therapeutic Exercise: UBE L8 x 4 min alternating fwd/back standing  Doorway pec stretch 2x30 sec low, mid, high Shoulder flexion hands on top of doorway 30 sec x 3  Sleeper stretch Rt sidelying noodle distal arm to position humerus in glenoid fossa - shoulder/elbow 90 deg flexion IR assist with Lt UE 20-30 sec x 3  Sidelying Shoulder abd 20-30 sec hold at end range for stretch PT assist with stretch  Shoulder abd yellow TB x10 Shoulder ER with resistance by PT at ~ 20 Manual Therapy: Deep tissue work Rt anterior shoulder with PROM/stretch into end range flexion pt supine Skilled palpation to assess response for manual work and dry needling  Trigger Point Dry-Needling  Patient Consent Given: Yes Education handout provided: Yes Muscles treated: Rt teres, lats; infraspinatus  Electrical stimulation performed: No Parameters: N/A Treatment response/outcome: decreased palpable tightness; pt reported some soreness   Neuromuscular re-ed: Working on posture and alignment with movement deg abduction blue TB x 10    [facility] Adult PT Treatment:  DATE: 01/13/22 Therapeutic Exercise: UBE L5 x 4 min alternating fwd/back standing  Doorway pec stretch 2x30 sec low, mid, high Sleeper stretch Rt  sidelying noodle distal arm to position humerus in glenoid fossa - shoulder/elbow 90 deg flexion IR assist with Lt UE 20-30 sec x 3  Sidelying Shoulder abd 10x5 sec hold at end range for stretch Shoulder abd yellow TB x10 Shoulder ER 3# x10 Bow and arrow green TB 2x10 cues to focus on scapular positioning Shoulder flexion x10 Shoulder flexion 3# x10 Mid deltoid raises to 90 deg shoulder abd 3# 2x10 Manual Therapy: Lat stretching Neuromuscular Re-ed: Tactile cueing for scapular movement with all sidelying movements Self Care: Self massage with theracane posterior shoulder Self massage with ball posterior shoulder, mid back and pec   [facility] Adult PT Treatment:                                                DATE: 01/11/22 Therapeutic Exercise: UBE L5 x 4 min alternating fwd/back standing  Lat stretch with patient overpressure supine 30 sec x 2; PT overpressure at lateral scapula 30 sec x 2  Biceps/anterior chest stretch at wall shd in ~ 80 deg horizontal abduction 30 sec x 2  Doorway stretch ball in Rt hand to increase stretch for Rt pecs  Sleeper stretch Rt sidelying noodle distal arm to position humerus in glenoid fossa - shoulder/elbow 90 deg flexion IR assist with Lt UE 20-30 sec x 3   Manual Therapy: Deep tissue work Rt anterior shoulder with PROM/stretch into end range flexion pt supine Skilled palpation to assess response for manual work and dry needling  Trigger Point Dry-Needling  Treatment instructions: Expect mild to moderate muscle soreness. S/S of pneumothorax if dry needled over a lung field, and to seek immediate medical attention should they occur. Patient verbalized understanding of these instructions and education.  Patient Consent Given: Yes Education handout provided: Yes Muscles treated: Rt pec; biceps tendon; supraspinatus; infraspinatus  Electrical stimulation performed: No Parameters: N/A Treatment response/outcome: decreased palpable tightness; pt reported some  soreness   Neuromuscular re-ed: Working on posture and alignment with movement    PATIENT EDUCATION: Education details: Discussed progressions for home -- I.e. using red and green band when tolerated Person educated: Patient Education method: Explanation, Demonstration, Tactile cues, Verbal cues, and Handouts Education comprehension: verbalized understanding, returned demonstration, verbal cues required, tactile cues required, and needs further education  HOME EXERCISE PROGRAM: Access Code: [email or web address] URL: [email or web address] Date: 01/17/2022 Prepared by: Corlis Leak  Exercises - Seated Cervical Retraction  - 3 x daily - 7 x weekly - 1 sets - 10 reps - Standing Scapular Retraction  - 3 x daily - 7 x weekly - 1 sets - 10 reps - 10 hold - Shoulder External Rotation and Scapular Retraction  - 3 x daily - 7 x weekly - 1 sets - 10 reps -   hold - Seated Shoulder W  - 2 x daily - 7 x weekly - 1 sets - 10 reps - 3 sec  hold - Doorway Pec Stretch at 60 Degrees Abduction  - 3 x daily - 7 x weekly - 1 sets - 3 reps - Doorway Pec Stretch at 90 Degrees Abduction  - 3 x daily - 7 x weekly - 1 sets - 3 reps - 30 seconds  hold - Doorway Pec  Stretch at 120 Degrees Abduction  - 3 x daily - 7 x weekly - 1 sets - 3 reps - 30 second hold  hold - Standing Pectoral Release with Shoulder Abduction with Ball at Wall  - 2-3 x daily - 7 x weekly - Standing Infraspinatus/Teres Minor Release with Ball at [employer]  - 2 x daily - 7 x weekly - Shoulder Flexion Serratus Activation with Resistance  - 1 x daily - 7 x weekly - 1 sets - 10 reps - 3-5 sec  hold - Wall Clock with Theraband  - 1 x daily - 7 x weekly - 1 sets - 10 reps - 2-3 sec  hold - Shoulder External Rotation in Abduction with Anchored Resistance  - 1 x daily - 7 x weekly - 1-3 sets - 10 reps - 3 sec  hold - Supine Shoulder Abduction AROM  - 1 x daily - 7 x weekly - 2 sets - 10 reps - Supine Shoulder External Rotation in Abduction  - 1 x daily -  7 x weekly - 2 sets - 10 reps - Supine Shoulder Internal Rotation Stretch  - 1 x daily - 7 x weekly - 2 sets - 10 reps - Sleeper Stretch  - 2 x daily - 7 x weekly - 1 sets - 3 reps - 30 sec  hold - Drawing Bow  - 1 x daily - 7 x weekly - 2 sets - 10 reps - Sidelying Shoulder Abduction Palm Forward  - 1 x daily - 7 x weekly - 3 sets - 10 reps - Sidelying Shoulder Flexion 15 Degrees  - 1 x daily - 7 x weekly - 3 sets - 10 reps - Standing Shoulder Flexion Stretch on Wall  - 2 x daily - 7 x weekly - 2 sets - 3 reps - 30 sec  hold Patient Education - Office Posture  ASSESSMENT:  CLINICAL IMPRESSION: Pt reports good progress. Good response to DN and manual work. Progressing with strengthening and stabilization. Working on Human resources officer.  OBJECTIVE IMPAIRMENTS: decreased mobility, decreased ROM, decreased strength, increased muscle spasms, impaired flexibility, impaired UE functional use, improper body mechanics, postural dysfunction, and pain.    GOALS: Goals reviewed with patient? Yes  SHORT TERM GOALS: Target date: 01/11/2022   Patient independent in initial HEP Baseline: Goal status: INITIAL  2.  Improve thoracic extension and position of scapulae on thoracic wall  Baseline:  Goal status: INITIAL    LONG TERM GOALS: Target date: 02/08/2022   Improve posture and alignment with patient to demonstrate improved upright posture with posterior shoulder girdle engaged. Baseline:  Goal status: INITIAL  2.  Increased AROM and mobility Rt shoulder to =/> than AROM Lt shoulder  Baseline:  Goal status: INITIAL  3.  Pain free AROM Rt shoulder Baseline:  Goal status: INITIAL  4.  Increase strength Rt shoulder to 5-/5 to 5/5 Baseline:  Goal status: INITIAL  5.  Independent in HEP  Baseline:  Goal status: INITIAL  6.  Improve functional limitation score to 74 Baseline:  Goal status: INITIAL  PLAN:  PT FREQUENCY: 2x/week  PT  DURATION: 8 weeks  PLANNED INTERVENTIONS: Therapeutic exercises, Therapeutic activity, Neuromuscular re-education, Patient/Family education, Self Care, Joint mobilization, Aquatic Therapy, Dry Needling, Spinal mobilization, Cryotherapy, Moist heat, Taping, Traction, Ultrasound, Ionotophoresis 4mg /ml Dexamethasone, Manual therapy, and Re-evaluation  PLAN FOR NEXT SESSION: review and progress postural correction and education as well as HEP; manual work and/or DN as indicated; modalities  as indicated  Trial of manual work with end range stretching of shoulder with scapula stabilization for ROM    Alondra Sahni P Lubertha Leite, PT 01/17/2022, 3:35 PM

## 2022-01-19 ENCOUNTER — Encounter: Payer: Managed Care, Other (non HMO) | Admitting: Rehabilitative and Restorative Service Providers"

## 2022-01-24 ENCOUNTER — Ambulatory Visit: Payer: Managed Care, Other (non HMO) | Admitting: Physical Therapy

## 2022-01-24 ENCOUNTER — Encounter: Payer: Self-pay | Admitting: Physical Therapy

## 2022-01-24 DIAGNOSIS — G8929 Other chronic pain: Secondary | ICD-10-CM

## 2022-01-24 DIAGNOSIS — R293 Abnormal posture: Secondary | ICD-10-CM

## 2022-01-24 DIAGNOSIS — R29898 Other symptoms and signs involving the musculoskeletal system: Secondary | ICD-10-CM

## 2022-01-24 DIAGNOSIS — M25511 Pain in right shoulder: Secondary | ICD-10-CM | POA: Diagnosis not present

## 2022-01-24 DIAGNOSIS — R531 Weakness: Secondary | ICD-10-CM

## 2022-01-24 NOTE — Therapy (Signed)
OUTPATIENT PHYSICAL THERAPY SHOULDER TREATMENT   Patient Name: Scott Hernandez MRN: [id number] DOB:April 02, 1960, 62 y.o., male Today's Date: 01/24/2022  END OF SESSION:  PT End of Session - 01/24/22 1449     Visit Number 9    Number of Visits 16    Date for PT Re-Evaluation 02/08/22    PT Start Time 1449    PT Stop Time 1530    PT Time Calculation (min) 41 min    Activity Tolerance Patient tolerated treatment well    Behavior During Therapy [facility] for tasks assessed/performed             Past Medical History:  Diagnosis Date   Hypertension 9/22   History reviewed. No pertinent surgical history. 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   Ganglion cyst 07/28/2011    PCP: Christen Butter, NP  REFERRING PROVIDER: Dr Rodney Langton  REFERRING DIAG: Partial nontraumatic tear Rt RC  THERAPY DIAG:  Chronic right shoulder pain  Abnormal posture  Weakness generalized  Other symptoms and signs involving the musculoskeletal system  Rationale for Evaluation and Treatment: Rehabilitation  ONSET DATE: 12/09/20  SUBJECTIVE:                                                                                                                                                                                      SUBJECTIVE STATEMENT: Pt states there is still pain with certain movements. Same places of pain but less. Pt states it no longer hurts to sleep on his right side. Pt states he is trying to do more strengthening against gravity (standing position).   PERTINENT HISTORY: Knee pain treated successfully with PT and HEP; HTN  From eval: Patient reports that he has had Rt shoulder pain for  the past year with no known injury. He has not had treatment other than doing some exercises that he found on line. Exercises have not helped. MRI showed partial tear of Rt supraspinatus.   PAIN:  Are you having pain? Yes: NPRS scale: 0/10 Pain location: top of shoulder  Pain description: dull  Aggravating factors: moving; lifting; reaching back; lifting straight up  Relieving factors: rest; OTC meds  PRECAUTIONS: Partial RC tear  WEIGHT BEARING RESTRICTIONS: No  FALLS:  Has patient fallen in last 6 months? 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  Yard work; Architect ;Dietitian; hiking; TEFL teacher; gym workout - mostly lower body and at home. Walking ~ 2 days a week for ~ 30-40 min ~ 3 miles; biking   PATIENT GOALS: avoid surgery; get rid of pain  NEXT MD VISIT:   OBJECTIVE:   DIAGNOSTIC FINDINGS:  MRI Rt shoulder 10/17/21: 1. Moderate tendinosis of the supraspinatus tendon with a high-grade partial-thickness bursal surface tear anteriorly. 2. Mild tendinosis of the infraspinatus tendon. 3. Mild subacromial/subdeltoid bursitis.  PATIENT SURVEYS:  FOTO 60  POSTURE: Patient presents with head forward posture with increased thoracic kyphosis; shoulders rounded and elevated; scapulae abducted and rotated along the thoracic spine; head of the humerus anterior in orientation.   UPPER EXTREMITY ROM:   Active ROM Right eval Left eval  Shoulder flexion 145 pulling, pain 160  Shoulder extension 62 64  Shoulder abduction 161 mild pain 162  Shoulder adduction    Shoulder internal rotation Thumb T8 mild pain Thumb T4  Shoulder external rotation 72 pain 90 shd 90/elbow 90   Elbow flexion    Elbow extension    Wrist flexion    Wrist extension    Wrist ulnar deviation    Wrist radial deviation    Wrist pronation    Wrist supination    (Blank rows = not tested)  UPPER EXTREMITY MMT: Pain  with resistive testing in Rt shoulder flexion; abduction; ER; IR  MMT Right eval Left eval  Shoulder flexion 4/5 5/5  Shoulder extension 5/5 5/5  Shoulder abduction 4-/5  5/5  Shoulder adduction    Shoulder internal rotation 4+/5 5/5  Shoulder external rotation 4-/5 5/5  Middle trapezius    Lower trapezius    Elbow flexion    Elbow extension    Wrist flexion    Wrist extension    Wrist ulnar deviation    Wrist radial deviation    Wrist pronation    Wrist supination    Grip strength (lbs)    (Blank rows = not tested)  PALPATION:  Muscular tightness Rt > Lt pecs; upper trap; leveator; teres; biceps tendon area   [facility] Adult PT Treatment:                                                DATE: 01/24/22 Therapeutic Exercise: UBE L8 x 4 min alternating fwd/back standing Doorway pec stretch 2x30 sec low, mid, high Sleeper stretch Rt sidelying noodle distal arm to position humerus in glenoid fossa - shoulder/elbow 90 deg flexion IR assist with Lt UE 20-30 sec x 2 Sidelying Shoulder abd stabilize against perturbations at 140 deg, 120 deg, and 100 deg 5x5 sec Shoulder abd AROM x10, red TB x10 Shoulder ext AROM x10, red TB x10 Shoulder IR AROM x10 (hand on sacrum & sliding up) Sitting Shoulder abd AROM x10 Diagonals red TB x10 Standing Deltoid 5# x10 Shoulder ext red TB 3x10 Neuromuscular re-ed: Tactile cueing for scapular stability with sidelying exercises   [facility] Adult PT Treatment:                                                DATE: 01/17/22 Therapeutic Exercise: UBE L8 x 4 min alternating  fwd/back standing  Doorway pec stretch 2x30 sec low, mid, high Shoulder flexion hands on top of doorway 30 sec x 3  Sleeper stretch Rt sidelying noodle distal arm to position humerus in glenoid fossa - shoulder/elbow 90 deg flexion IR assist with Lt UE 20-30 sec x 3  Sidelying Shoulder abd 20-30 sec hold at end range for stretch PT assist with stretch  Shoulder abd yellow TB x10 Shoulder ER  with resistance by PT at ~ 20  Manual Therapy: Deep tissue work Rt anterior shoulder with PROM/stretch into end range flexion pt supine Skilled palpation to assess response for manual work and dry needling  Trigger Point Dry-Needling  Patient Consent Given: Yes Education handout provided: Yes Muscles treated: Rt teres, lats; infraspinatus  Electrical stimulation performed: No Parameters: N/A Treatment response/outcome: decreased palpable tightness; pt reported some soreness   Neuromuscular re-ed: Working on posture and alignment with movement deg abduction blue TB x 10    [facility] Adult PT Treatment:                                                DATE: 01/13/22 Therapeutic Exercise: UBE L5 x 4 min alternating fwd/back standing  Doorway pec stretch 2x30 sec low, mid, high Sleeper stretch Rt sidelying noodle distal arm to position humerus in glenoid fossa - shoulder/elbow 90 deg flexion IR assist with Lt UE 20-30 sec x 3  Sidelying Shoulder abd 10x5 sec hold at end range for stretch Shoulder abd yellow TB x10 Shoulder ER 3# x10 Bow and arrow green TB 2x10 cues to focus on scapular positioning Shoulder flexion x10 Shoulder flexion 3# x10 Mid deltoid raises to 90 deg shoulder abd 3# 2x10 Manual Therapy: Lat stretching Neuromuscular Re-ed: Tactile cueing for scapular movement with all sidelying movements Self Care: Self massage with theracane posterior shoulder Self massage with ball posterior shoulder, mid back and pec  PATIENT EDUCATION: Education details: Discussed progressions for home -- I.e. using red and green band when tolerated Person educated: Patient Education method: Explanation, Demonstration, Tactile cues, Verbal cues, and Handouts Education comprehension: verbalized understanding, returned demonstration, verbal cues required, tactile cues required, and needs further education  HOME EXERCISE PROGRAM: Access Code: [email or web address] URL: [email or web address] Date:  01/17/2022 Prepared by: Gillermo Murdoch  Exercises - Seated Cervical Retraction  - 3 x daily - 7 x weekly - 1 sets - 10 reps - Standing Scapular Retraction  - 3 x daily - 7 x weekly - 1 sets - 10 reps - 10 hold - Shoulder External Rotation and Scapular Retraction  - 3 x daily - 7 x weekly - 1 sets - 10 reps -   hold - Seated Shoulder W  - 2 x daily - 7 x weekly - 1 sets - 10 reps - 3 sec  hold - Doorway Pec Stretch at 60 Degrees Abduction  - 3 x daily - 7 x weekly - 1 sets - 3 reps - Doorway Pec Stretch at 90 Degrees Abduction  - 3 x daily - 7 x weekly - 1 sets - 3 reps - 30 seconds  hold - Doorway Pec Stretch at 120 Degrees Abduction  - 3 x daily - 7 x weekly - 1 sets - 3 reps - 30 second hold  hold - Standing Pectoral Release with Shoulder Abduction with Ball at Wall  - 2-3 x daily -  7 x weekly - Standing Infraspinatus/Teres Minor Release with Ball at [employer]  - 2 x daily - 7 x weekly - Shoulder Flexion Serratus Activation with Resistance  - 1 x daily - 7 x weekly - 1 sets - 10 reps - 3-5 sec  hold - Wall Clock with Theraband  - 1 x daily - 7 x weekly - 1 sets - 10 reps - 2-3 sec  hold - Shoulder External Rotation in Abduction with Anchored Resistance  - 1 x daily - 7 x weekly - 1-3 sets - 10 reps - 3 sec  hold - Supine Shoulder Abduction AROM  - 1 x daily - 7 x weekly - 2 sets - 10 reps - Supine Shoulder External Rotation in Abduction  - 1 x daily - 7 x weekly - 2 sets - 10 reps - Supine Shoulder Internal Rotation Stretch  - 1 x daily - 7 x weekly - 2 sets - 10 reps - Sleeper Stretch  - 2 x daily - 7 x weekly - 1 sets - 3 reps - 30 sec  hold - Drawing Bow  - 1 x daily - 7 x weekly - 2 sets - 10 reps - Sidelying Shoulder Abduction Palm Forward  - 1 x daily - 7 x weekly - 3 sets - 10 reps - Sidelying Shoulder Flexion 15 Degrees  - 1 x daily - 7 x weekly - 3 sets - 10 reps - Standing Shoulder Flexion Stretch on Wall  - 2 x daily - 7 x weekly - 2 sets - 3 reps - 30 sec  hold Patient Education -  Office Posture  ASSESSMENT:  CLINICAL IMPRESSION: Pt continues to progress well. Still requires cueing for scapular stability. Pt is demonstrating good scapular strength but will at times allow increased anterior tilt and protraction with glenohumeral movement. Session focused on maintaining pain free movements with shoulder abd, IR, and extension.   OBJECTIVE IMPAIRMENTS: decreased mobility, decreased ROM, decreased strength, increased muscle spasms, impaired flexibility, impaired UE functional use, improper body mechanics, postural dysfunction, and pain.    GOALS: Goals reviewed with patient? Yes  SHORT TERM GOALS: Target date: 01/11/2022   Patient independent in initial HEP Baseline: Goal status: MET  2.  Improve thoracic extension and position of scapulae on thoracic wall  Baseline:  Goal status: MET    LONG TERM GOALS: Target date: 02/08/2022   Improve posture and alignment with patient to demonstrate improved upright posture with posterior shoulder girdle engaged. Baseline:  Goal status: INITIAL  2.  Increased AROM and mobility Rt shoulder to =/> than AROM Lt shoulder  Baseline:  Goal status: INITIAL  3.  Pain free AROM Rt shoulder Baseline:  Goal status: INITIAL  4.  Increase strength Rt shoulder to 5-/5 to 5/5 Baseline:  Goal status: INITIAL  5.  Independent in HEP  Baseline:  Goal status: INITIAL  6.  Improve functional limitation score to 74 Baseline:  Goal status: INITIAL  PLAN:  PT FREQUENCY: 2x/week  PT DURATION: 8 weeks  PLANNED INTERVENTIONS: Therapeutic exercises, Therapeutic activity, Neuromuscular re-education, Patient/Family education, Self Care, Joint mobilization, Aquatic Therapy, Dry Needling, Spinal mobilization, Cryotherapy, Moist heat, Taping, Traction, Ultrasound, Ionotophoresis 4mg /ml Dexamethasone, Manual therapy, and Re-evaluation  PLAN FOR NEXT SESSION: review and progress postural correction and education as well as HEP; manual  work and/or DN as indicated; modalities as indicated  Trial of manual work with end range stretching of shoulder with scapula stabilization for ROM    Emagene Merfeld  April Ma L Emonnie Cannady, PT 01/24/2022, 2:49 PM

## 2022-01-31 ENCOUNTER — Encounter: Payer: Self-pay | Admitting: Physical Therapy

## 2022-01-31 ENCOUNTER — Ambulatory Visit: Payer: Managed Care, Other (non HMO) | Admitting: Physical Therapy

## 2022-01-31 DIAGNOSIS — R293 Abnormal posture: Secondary | ICD-10-CM

## 2022-01-31 DIAGNOSIS — R29898 Other symptoms and signs involving the musculoskeletal system: Secondary | ICD-10-CM

## 2022-01-31 DIAGNOSIS — M25511 Pain in right shoulder: Secondary | ICD-10-CM | POA: Diagnosis not present

## 2022-01-31 DIAGNOSIS — R531 Weakness: Secondary | ICD-10-CM

## 2022-01-31 DIAGNOSIS — G8929 Other chronic pain: Secondary | ICD-10-CM

## 2022-01-31 NOTE — Therapy (Signed)
OUTPATIENT PHYSICAL THERAPY SHOULDER TREATMENT   Patient Name: Scott Hernandez MRN: [id number] DOB:01-Feb-1960, 62 y.o., male Today's Date: 01/31/2022  END OF SESSION:  PT End of Session - 01/31/22 1448     Visit Number 10    Number of Visits 16    Date for PT Re-Evaluation 02/08/22    Authorization Type [employer] -- will require auth after 5th visit (visit #10)    Authorization - Visit Number 5    Authorization - Number of Visits 5    PT Start Time 7408    PT Stop Time 1530    PT Time Calculation (min) 42 min    Activity Tolerance Patient tolerated treatment well    Behavior During Therapy [facility] for tasks assessed/performed              Past Medical History:  Diagnosis Date   Hypertension 9/22   History reviewed. No pertinent surgical history. 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   Ganglion cyst 07/28/2011    PCP: Samuel Bouche, NP  REFERRING PROVIDER: Dr Aundria Mems  REFERRING DIAG: Partial nontraumatic tear Rt RC  THERAPY DIAG:  Chronic right shoulder pain  Abnormal posture  Weakness generalized  Other symptoms and signs involving the musculoskeletal system  Rationale for Evaluation and Treatment: Rehabilitation  ONSET DATE: 12/09/20  SUBJECTIVE:                                                                                                                                                                                      SUBJECTIVE STATEMENT: Pt reports shoulder is about the same. Still able to sleep on it.   PERTINENT HISTORY: Knee pain treated successfully with PT and HEP; HTN  From eval: Patient reports that he has had Rt shoulder pain for the  past year with no known injury. He has not had treatment other than doing some exercises that he found on line. Exercises have not helped. MRI showed partial tear of Rt supraspinatus.   PAIN:  Are you having pain? Yes: NPRS scale: 0/10 Pain location: top of shoulder  Pain description: dull  Aggravating factors: moving; lifting; reaching back; lifting straight up  Relieving factors: rest; OTC meds  PRECAUTIONS: Partial RC tear  WEIGHT BEARING RESTRICTIONS: No  FALLS:  Has patient fallen in last 6 months? 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  Yard work; Location manager ;Engineering geologist; hiking; Publishing rights manager; gym workout - mostly lower body and at home. Walking ~ 2 days a week for ~ 30-40 min ~ 3 miles; biking   PATIENT GOALS: avoid surgery; get rid of pain  NEXT MD VISIT:   OBJECTIVE:   DIAGNOSTIC FINDINGS:  MRI Rt shoulder 10/17/21: 1. Moderate tendinosis of the supraspinatus tendon with a high-grade partial-thickness bursal surface tear anteriorly. 2. Mild tendinosis of the infraspinatus tendon. 3. Mild subacromial/subdeltoid bursitis.  PATIENT SURVEYS:  FOTO 60  POSTURE: Patient presents with head forward posture with increased thoracic kyphosis; shoulders rounded and elevated; scapulae abducted and rotated along the thoracic spine; head of the humerus anterior in orientation.   UPPER EXTREMITY ROM:   Active ROM Right eval Left eval  Shoulder flexion 145 pulling, pain 160  Shoulder extension 62 64  Shoulder abduction 161 mild pain 162  Shoulder adduction    Shoulder internal rotation Thumb T8 mild pain Thumb T4  Shoulder external rotation 72 pain 90 shd 90/elbow 90   Elbow flexion    Elbow extension    Wrist flexion    Wrist extension    Wrist ulnar deviation    Wrist radial deviation    Wrist pronation    Wrist supination    (Blank rows = not tested)  UPPER EXTREMITY MMT: Pain with  resistive testing in Rt shoulder flexion; abduction; ER; IR  MMT Right eval Left eval  Shoulder flexion 4/5 5/5  Shoulder extension 5/5 5/5  Shoulder abduction 4-/5  5/5  Shoulder adduction    Shoulder internal rotation 4+/5 5/5  Shoulder external rotation 4-/5 5/5  Middle trapezius    Lower trapezius    Elbow flexion    Elbow extension    Wrist flexion    Wrist extension    Wrist ulnar deviation    Wrist radial deviation    Wrist pronation    Wrist supination    Grip strength (lbs)    (Blank rows = not tested)  PALPATION:  Muscular tightness Rt > Lt pecs; upper trap; leveator; teres; biceps tendon area   [facility] Adult PT Treatment:                                                DATE: 01/31/22 Therapeutic Exercise: UBE L8 x 4 min alternating fwd/back standing Doorway pec stretch 2x30 sec low, mid, high Sleeper stretch Rt sidelying noodle distal arm to position humerus in glenoid fossa - shoulder/elbow 90 deg flexion IR assist with Lt UE 20-30 sec x 2 Sitting Scap retraction/post tilted with shoulder abd 90 deg and elbow 90 deg, shoulder ER 10x3 sec red TB Scap retraction/post tilted with shoulder horizontal abd at 90 deg abd and 90 deg elbow bend 3x10 red TB Scap retraction/post tilted with shoulder ext x10 red TB, 2x10 green TB Diagonals red TB 3x10 Scap retraction/post tilted with shoulder horizontal abd at 45 deg abd, elbow straight 3x10 red TB Supine Resisted Serratus Anterior Punches with Elbow Bent red TB 3x10   [facility] Adult PT Treatment:  DATE: 01/24/22 Therapeutic Exercise: UBE L8 x 4 min alternating fwd/back standing Doorway pec stretch 2x30 sec low, mid, high Sleeper stretch Rt sidelying noodle distal arm to position humerus in glenoid fossa - shoulder/elbow 90 deg flexion IR assist with Lt UE 20-30 sec x 2 Sidelying Shoulder abd stabilize against perturbations at 140 deg, 120 deg, and 100 deg 5x5 sec Shoulder abd AROM  x10, red TB x10 Shoulder ext AROM x10, red TB x10 Shoulder IR AROM x10 (hand on sacrum & sliding up) Sitting Shoulder abd AROM x10 Diagonals red TB x10 Standing Deltoid 5# x10 Shoulder ext red TB 3x10 Neuromuscular re-ed: Tactile cueing for scapular stability with sidelying exercises   [facility] Adult PT Treatment:                                                DATE: 01/17/22 Therapeutic Exercise: UBE L8 x 4 min alternating fwd/back standing  Doorway pec stretch 2x30 sec low, mid, high Shoulder flexion hands on top of doorway 30 sec x 3  Sleeper stretch Rt sidelying noodle distal arm to position humerus in glenoid fossa - shoulder/elbow 90 deg flexion IR assist with Lt UE 20-30 sec x 3  Sidelying Shoulder abd 20-30 sec hold at end range for stretch PT assist with stretch  Shoulder abd yellow TB x10 Shoulder ER with resistance by PT at ~ 20  Manual Therapy: Deep tissue work Rt anterior shoulder with PROM/stretch into end range flexion pt supine Skilled palpation to assess response for manual work and dry needling  Trigger Point Dry-Needling  Patient Consent Given: Yes Education handout provided: Yes Muscles treated: Rt teres, lats; infraspinatus  Electrical stimulation performed: No Parameters: N/A Treatment response/outcome: decreased palpable tightness; pt reported some soreness   Neuromuscular re-ed: Working on posture and alignment with movement deg abduction blue TB x 10    PATIENT EDUCATION: Education details: Discussed progressions for home -- I.e. using red and green band when tolerated Person educated: Patient Education method: Explanation, Demonstration, Tactile cues, Verbal cues, and Handouts Education comprehension: verbalized understanding, returned demonstration, verbal cues required, tactile cues required, and needs further education  HOME EXERCISE PROGRAM: Access Code: [email or web address] URL: [email or web address] Date: 01/17/2022 Prepared by: Corlis Leak  Exercises - Seated Cervical Retraction  - 3 x daily - 7 x weekly - 1 sets - 10 reps - Standing Scapular Retraction  - 3 x daily - 7 x weekly - 1 sets - 10 reps - 10 hold - Shoulder External Rotation and Scapular Retraction  - 3 x daily - 7 x weekly - 1 sets - 10 reps -   hold - Seated Shoulder W  - 2 x daily - 7 x weekly - 1 sets - 10 reps - 3 sec  hold - Doorway Pec Stretch at 60 Degrees Abduction  - 3 x daily - 7 x weekly - 1 sets - 3 reps - Doorway Pec Stretch at 90 Degrees Abduction  - 3 x daily - 7 x weekly - 1 sets - 3 reps - 30 seconds  hold - Doorway Pec Stretch at 120 Degrees Abduction  - 3 x daily - 7 x weekly - 1 sets - 3 reps - 30 second hold  hold - Standing Pectoral Release with Shoulder Abduction with Ball at Wall  - 2-3 x daily - 7 x weekly - Standing  Infraspinatus/Teres Minor Release with Ball at [employer]  - 2 x daily - 7 x weekly - Shoulder Flexion Serratus Activation with Resistance  - 1 x daily - 7 x weekly - 1 sets - 10 reps - 3-5 sec  hold - Wall Clock with Theraband  - 1 x daily - 7 x weekly - 1 sets - 10 reps - 2-3 sec  hold - Shoulder External Rotation in Abduction with Anchored Resistance  - 1 x daily - 7 x weekly - 1-3 sets - 10 reps - 3 sec  hold - Supine Shoulder Abduction AROM  - 1 x daily - 7 x weekly - 2 sets - 10 reps - Supine Shoulder External Rotation in Abduction  - 1 x daily - 7 x weekly - 2 sets - 10 reps - Supine Shoulder Internal Rotation Stretch  - 1 x daily - 7 x weekly - 2 sets - 10 reps - Sleeper Stretch  - 2 x daily - 7 x weekly - 1 sets - 3 reps - 30 sec  hold - Drawing Bow  - 1 x daily - 7 x weekly - 2 sets - 10 reps - Sidelying Shoulder Abduction Palm Forward  - 1 x daily - 7 x weekly - 3 sets - 10 reps - Sidelying Shoulder Flexion 15 Degrees  - 1 x daily - 7 x weekly - 3 sets - 10 reps - Standing Shoulder Flexion Stretch on Wall  - 2 x daily - 7 x weekly - 2 sets - 3 reps - 30 sec  hold Patient Education - Office  Posture  ASSESSMENT:  CLINICAL IMPRESSION: Continued to work on rotator cuff and scapular stabilization and strengthening. Improving ability to maintain posterior scapular tilt and retraction.   OBJECTIVE IMPAIRMENTS: decreased mobility, decreased ROM, decreased strength, increased muscle spasms, impaired flexibility, impaired UE functional use, improper body mechanics, postural dysfunction, and pain.    GOALS: Goals reviewed with patient? Yes  SHORT TERM GOALS: Target date: 01/11/2022   Patient independent in initial HEP Baseline: Goal status: MET  2.  Improve thoracic extension and position of scapulae on thoracic wall  Baseline:  Goal status: MET    LONG TERM GOALS: Target date: 02/08/2022   Improve posture and alignment with patient to demonstrate improved upright posture with posterior shoulder girdle engaged. Baseline:  Goal status: INITIAL  2.  Increased AROM and mobility Rt shoulder to =/> than AROM Lt shoulder  Baseline:  Goal status: INITIAL  3.  Pain free AROM Rt shoulder Baseline:  Goal status: INITIAL  4.  Increase strength Rt shoulder to 5-/5 to 5/5 Baseline:  Goal status: INITIAL  5.  Independent in HEP  Baseline:  Goal status: INITIAL  6.  Improve functional limitation score to 74 Baseline:  Goal status: INITIAL  PLAN:  PT FREQUENCY: 2x/week  PT DURATION: 8 weeks  PLANNED INTERVENTIONS: Therapeutic exercises, Therapeutic activity, Neuromuscular re-education, Patient/Family education, Self Care, Joint mobilization, Aquatic Therapy, Dry Needling, Spinal mobilization, Cryotherapy, Moist heat, Taping, Traction, Ultrasound, Ionotophoresis 4mg /ml Dexamethasone, Manual therapy, and Re-evaluation  PLAN FOR NEXT SESSION: review and progress postural correction and education as well as HEP; manual work and/or DN as indicated; modalities as indicated  Trial of manual work with end range stretching of shoulder with scapula stabilization for ROM     Bonham Zingale April Ma L Thamas Appleyard, PT 01/31/2022, 2:49 PM

## 2022-02-02 ENCOUNTER — Telehealth (INDEPENDENT_AMBULATORY_CARE_PROVIDER_SITE_OTHER): Payer: Managed Care, Other (non HMO) | Admitting: Medical-Surgical

## 2022-02-02 ENCOUNTER — Encounter: Payer: Self-pay | Admitting: Medical-Surgical

## 2022-02-02 DIAGNOSIS — I10 Essential (primary) hypertension: Secondary | ICD-10-CM | POA: Diagnosis not present

## 2022-02-02 DIAGNOSIS — F419 Anxiety disorder, unspecified: Secondary | ICD-10-CM | POA: Diagnosis not present

## 2022-02-02 DIAGNOSIS — F1320 Sedative, hypnotic or anxiolytic dependence, uncomplicated: Secondary | ICD-10-CM

## 2022-02-02 MED ORDER — ALPRAZOLAM 0.5 MG PO TABS: 0.5000 mg | ORAL_TABLET | Freq: Two times a day (BID) | ORAL | 5 refills | Status: DC | PRN

## 2022-02-02 MED ORDER — LISINOPRIL 5 MG PO TABS: 5.0000 mg | ORAL_TABLET | Freq: Every day | ORAL | 1 refills | Status: DC

## 2022-02-02 NOTE — Progress Notes (Signed)
Virtual Visit via Video Note  I connected with Dayna Geurts on 02/02/22 at  1:00 PM EST by a video enabled telemedicine application and verified that I am speaking with the correct person using two identifiers.   I discussed the limitations of evaluation and management by telemedicine and the availability of in person appointments. The patient expressed understanding and agreed to proceed.  Patient location: home Provider locations: office  Subjective:    CC: Hypertension/anxiety follow-up  HPI: Pleasant 62 year old male presenting via [place] video visit for the following:  Hypertension: Has been checking blood pressure at home and reports his readings have been below the goal of 130/80 on a regular basis.  Lisinopril 5 mg daily, tolerating well without side effects.  Following a low-sodium diet.  Continues to exercise regularly. Denies CP, SOB, palpitations, lower extremity edema, dizziness, headaches, or vision changes.  Anxiety: Has been treated long-term for anxiety with Xanax 0.5 mg twice daily.  We have discussed the use of Xanax in the past but he continues to take it regularly rather than on an as-needed basis.  Ultimately, would like to get him on an SSRI to help with baseline anxiety so that we could reduce the need for Xanax.  Since he has been on this medication for so long, suspect a psychological and physical dependence is present.  Denies SI/HI.  Past medical history, Surgical history, Family history not pertinant except as noted below, Social history, Allergies, and medications have been entered into the medical record, reviewed, and corrections made.   Review of Systems: See HPI for pertinent positives and negatives.   Objective:    General: Speaking clearly in complete sentences without any shortness of breath.  Alert and oriented x3.  Normal judgment. No apparent acute distress.  Impression and Recommendations:    1. Essential hypertension Blood pressure at goal.   Continue lisinopril 5 mg daily Continue to monitor BP at home with a goal of 130/80 or less. Recommend low sodium diet, regular intentional exercise, and maintaining a healthy weight.   2. Anxiety 3. Benzodiazepine dependence ([place]) He is compliant with dosing and does not overuse the medication.  Continue Xanax 0.5 mg twice daily with a goal for reducing the use to only as needed rather than scheduled.  I discussed the assessment and treatment plan with the patient. The patient was provided an opportunity to ask questions and all were answered. The patient agreed with the plan and demonstrated an understanding of the instructions.   The patient was advised to call back or seek an in-person evaluation if the symptoms worsen or if the condition fails to improve as anticipated.  25 minutes of non-face-to-face time was provided during this encounter.  Return in about 6 months (around 08/03/2022) for HTN/HLD/anxiety follow-up.  Clearnce Sorrel, DNP, APRN, FNP-BC [facility]

## 2022-02-07 ENCOUNTER — Ambulatory Visit: Payer: Managed Care, Other (non HMO) | Admitting: Rehabilitative and Restorative Service Providers"

## 2022-02-07 ENCOUNTER — Encounter: Payer: Self-pay | Admitting: Rehabilitative and Restorative Service Providers"

## 2022-02-07 DIAGNOSIS — M25511 Pain in right shoulder: Secondary | ICD-10-CM | POA: Diagnosis not present

## 2022-02-07 DIAGNOSIS — R29898 Other symptoms and signs involving the musculoskeletal system: Secondary | ICD-10-CM

## 2022-02-07 DIAGNOSIS — G8929 Other chronic pain: Secondary | ICD-10-CM

## 2022-02-07 DIAGNOSIS — R293 Abnormal posture: Secondary | ICD-10-CM

## 2022-02-07 DIAGNOSIS — R531 Weakness: Secondary | ICD-10-CM

## 2022-02-07 NOTE — Therapy (Addendum)
OUTPATIENT PHYSICAL THERAPY SHOULDER TREATMENT AND DISCHARGE SUMMARY   PHYSICAL THERAPY DISCHARGE SUMMARY  Visits from Start of Care: 11  Current functional level related to goals / functional outcomes: SEE PROGRESS NOTE FOR DISCHARGE STATUS    Remaining deficits: Needs to continue with stretching and strengthening    Education / Equipment: HEP    Patient agrees to discharge. Patient goals were met. Patient is being discharged due to meeting the stated rehab goals. Scott Hernandez P. Helene Kelp PT, MPH 04/05/22 2:43 PM  Patient Name: Scott Hernandez MRN: [id number] DOB:[birth year], 61 y.o., male Today's Date: 02/07/2022  END OF SESSION:  PT End of Session - 02/07/22 1454     Visit Number 11    Number of Visits 16              Past Medical History:  Diagnosis Date   Hypertension 9/22   History reviewed. No pertinent surgical history. 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   Ganglion cyst 07/28/2011    PCP: Samuel Bouche, NP  REFERRING PROVIDER: Dr Aundria Mems  REFERRING DIAG: Partial nontraumatic tear Rt RC  THERAPY DIAG:  Chronic right shoulder pain  Abnormal posture  Weakness generalized  Other symptoms and signs involving the musculoskeletal system  Rationale for Evaluation and Treatment: Rehabilitation  ONSET DATE: 12/09/20  SUBJECTIVE:                                                                                                                                                                                      SUBJECTIVE STATEMENT: Pt reports some pain in the Rt shoulder yesterday probably because he awoke lying on the shoulder and had pain. Back to  baseline today.  PERTINENT HISTORY: Knee pain treated successfully with PT and HEP; HTN  From eval: Patient reports that he has had Rt shoulder pain for the past year with no known injury. He has not had treatment other than doing some exercises that he found on line. Exercises have not helped. MRI showed partial tear of Rt supraspinatus.   PAIN:  Are you having pain? Yes: NPRS scale: 0/10 Pain location: top of shoulder  Pain description: dull  Aggravating factors: moving; lifting; reaching back; lifting straight up  Relieving factors: rest; OTC meds  PRECAUTIONS: Partial RC tear  WEIGHT BEARING RESTRICTIONS: No  FALLS:  Has patient fallen in last 6 months?  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  Yard work; Location manager ;Engineering geologist; hiking; Publishing rights manager; gym workout - mostly lower body and at home. Walking ~ 2 days a week for ~ 30-40 min ~ 3 miles; biking   PATIENT GOALS: avoid surgery; get rid of pain  NEXT MD VISIT:   OBJECTIVE:   DIAGNOSTIC FINDINGS:  MRI Rt shoulder 10/17/21: 1. Moderate tendinosis of the supraspinatus tendon with a high-grade partial-thickness bursal surface tear anteriorly. 2. Mild tendinosis of the infraspinatus tendon. 3. Mild subacromial/subdeltoid bursitis.  PATIENT SURVEYS:  FOTO 60 02/07/22: 47  POSTURE: Patient presents with head forward posture with increased thoracic kyphosis; shoulders rounded and elevated; scapulae abducted and rotated along the thoracic spine; head of the humerus anterior in orientation.   UPPER EXTREMITY ROM: mild discomfort but no pain with AROM   Active ROM Right eval Right  02/07/22 Left eval  Shoulder flexion 145 pulling, pain 155 160  Shoulder extension 62 64 64  Shoulder abduction 161 mild pain 162 162  Shoulder adduction     Shoulder internal rotation Thumb T8 mild pain Thumb T7 Thumb T4  Shoulder external rotation 72 pain 90  Shd 90/elbow 90  90 shd 90/elbow 90   Elbow flexion     Elbow extension     Wrist flexion     Wrist extension     Wrist ulnar deviation     Wrist radial deviation     Wrist pronation     Wrist supination     (Blank rows = not tested)  UPPER EXTREMITY MMT: Pain with resistive testing in Rt shoulder flexion; abduction; ER; IR  02/07/22: no pain with resistive testing  MMT Right eval Right 02/07/22 Left eval  Shoulder flexion 4/5 4+/5 5/5  Shoulder extension 5/5 5/5 5/5  Shoulder abduction 4-/5  4+/4 5/5  Shoulder adduction     Shoulder internal rotation 4+/5 5-/5 5/5  Shoulder external rotation 4-/5 5-/5 5/5  Middle trapezius     Lower trapezius     Elbow flexion     Elbow extension     Wrist flexion     Wrist extension     Wrist ulnar deviation     Wrist radial deviation     Wrist pronation     Wrist supination     Grip strength (lbs)     (Blank rows = not tested)  PALPATION:  Muscular tightness Rt > Lt pecs; upper trap; leveator; teres; biceps tendon area   [facility] Adult PT Treatment:                                                DATE: 130/24 Therapeutic Exercise: UBE L10 x 4 min alternating fwd/back standing  Doorway pec stretch 2x30 sec low, mid, high added slight turn to Lt to increase stretch on Rt; 4 " ball Rt wrist to increase stretch Rt  Shoulder flexion hands on top of doorway 30 sec x 3  Sleeper stretch Rt sidelying noodle distal arm to position humerus in glenoid fossa - shoulder/elbow 90 deg flexion IR assist with Lt UE 20-30 sec x 3  Supine:  Prolonged snow angel on coregeous ball T-spine UE's at ~ 75 deg  Manual Therapy: Deep tissue work Rt anterior shoulder with  Joint  mobs Ant to post [facility] joint Grade II/II  PROM/stretch into end range flexion pt supine Skilled palpation to assess response for manual work and dry needling  Trigger Point Dry-Needling  Patient Consent Given: Yes Education handout provided: Yes Muscles treated: Rt teres, lats;  infraspinatus  Electrical stimulation performed: yes Parameters: mAmp current to pt tolerance  Treatment response/outcome: decreased palpable tightness; pt reported some soreness   Neuromuscular re-ed: Working on posture and alignment focus on posterior shoulder girdle activation    [facility] Adult PT Treatment:                                                DATE: 01/31/22 Therapeutic Exercise: UBE L8 x 4 min alternating fwd/back standing Doorway pec stretch 2x30 sec low, mid, high Sleeper stretch Rt sidelying noodle distal arm to position humerus in glenoid fossa - shoulder/elbow 90 deg flexion IR assist with Lt UE 20-30 sec x 2 Sitting Scap retraction/post tilted with shoulder abd 90 deg and elbow 90 deg, shoulder ER 10x3 sec red TB Scap retraction/post tilted with shoulder horizontal abd at 90 deg abd and 90 deg elbow bend 3x10 red TB Scap retraction/post tilted with shoulder ext x10 red TB, 2x10 green TB Diagonals red TB 3x10 Scap retraction/post tilted with shoulder horizontal abd at 45 deg abd, elbow straight 3x10 red TB Supine Resisted Serratus Anterior Punches with Elbow Bent red TB 3x10   PATIENT EDUCATION: Education details: Discussed progressions for home -- I.e. using red and green band when tolerated Person educated: Patient Education method: Explanation, Demonstration, Tactile cues, Verbal cues, and Handouts Education comprehension: verbalized understanding, returned demonstration, verbal cues required, tactile cues required, and needs further education  HOME EXERCISE PROGRAM: Access Code: [email or web address] URL: [email or web address] Date: 01/17/2022 Prepared by: Gillermo Murdoch  Exercises - Seated Cervical Retraction  - 3 x daily - 7 x weekly - 1 sets - 10 reps - Standing Scapular Retraction  - 3 x daily - 7 x weekly - 1 sets - 10 reps - 10 hold - Shoulder External Rotation and Scapular Retraction  - 3 x daily - 7 x weekly - 1 sets - 10 reps -   hold - Seated Shoulder W   - 2 x daily - 7 x weekly - 1 sets - 10 reps - 3 sec  hold - Doorway Pec Stretch at 60 Degrees Abduction  - 3 x daily - 7 x weekly - 1 sets - 3 reps - Doorway Pec Stretch at 90 Degrees Abduction  - 3 x daily - 7 x weekly - 1 sets - 3 reps - 30 seconds  hold - Doorway Pec Stretch at 120 Degrees Abduction  - 3 x daily - 7 x weekly - 1 sets - 3 reps - 30 second hold  hold - Standing Pectoral Release with Shoulder Abduction with Ball at Wall  - 2-3 x daily - 7 x weekly - Standing Infraspinatus/Teres Minor Release with Ball at [employer]  - 2 x daily - 7 x weekly - Shoulder Flexion Serratus Activation with Resistance  - 1 x daily - 7 x weekly - 1 sets - 10 reps - 3-5 sec  hold - Wall Clock with Theraband  - 1 x daily - 7 x weekly - 1 sets - 10 reps - 2-3 sec  hold - Shoulder External Rotation in Abduction with Anchored  Resistance  - 1 x daily - 7 x weekly - 1-3 sets - 10 reps - 3 sec  hold - Supine Shoulder Abduction AROM  - 1 x daily - 7 x weekly - 2 sets - 10 reps - Supine Shoulder External Rotation in Abduction  - 1 x daily - 7 x weekly - 2 sets - 10 reps - Supine Shoulder Internal Rotation Stretch  - 1 x daily - 7 x weekly - 2 sets - 10 reps - Sleeper Stretch  - 2 x daily - 7 x weekly - 1 sets - 3 reps - 30 sec  hold - Drawing Bow  - 1 x daily - 7 x weekly - 2 sets - 10 reps - Sidelying Shoulder Abduction Palm Forward  - 1 x daily - 7 x weekly - 3 sets - 10 reps - Sidelying Shoulder Flexion 15 Degrees  - 1 x daily - 7 x weekly - 3 sets - 10 reps - Standing Shoulder Flexion Stretch on Wall  - 2 x daily - 7 x weekly - 2 sets - 3 reps - 30 sec  hold Patient Education - Office Posture  ASSESSMENT:  CLINICAL IMPRESSION: Patient reports improvement in symptoms Rt shoulder. He has increased ROM and increased strength and functional abilities. He has pain with certain movements including elevation of shoulder. He demonstrates good gains in ROM and strength on re-testing. Scott Hernandez has persistent scapular  dysfunction with scapulae abducted and rotated along the thoracic spine. 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 He will continue to benefit from therapy to achieve goals and reach maximum level of function. Continued to work on rotator cuff and scapular stabilization and strengthening.   OBJECTIVE IMPAIRMENTS: decreased mobility, decreased ROM, decreased strength, increased muscle spasms, impaired flexibility, impaired UE functional use, improper body mechanics, postural dysfunction, and pain.    GOALS: Goals reviewed with patient? Yes  SHORT TERM GOALS: Target date: 01/11/2022   Patient independent in initial HEP Baseline: Goal status: MET  2.  Improve thoracic extension and position of scapulae on thoracic wall  Baseline:  Goal status: MET    LONG TERM GOALS: Target date: 04/04/2022   Improve posture and alignment with patient to demonstrate improved upright posture with posterior shoulder girdle engaged. Baseline:  Goal status: on going  2.  Increased AROM and mobility Rt shoulder to =/> than AROM Lt shoulder  Baseline:  Goal status: on going  3.  Pain free AROM Rt shoulder Baseline:  Goal status: on going  4.  Increase strength Rt shoulder to 5-/5 to 5/5 Baseline:  Goal status: on going  5.  Independent in HEP  Baseline:  Goal status: on going  6.  Improve functional limitation score to 74 Baseline: 60 02/07/22: 55 Goal status: on going  PLAN:  PT FREQUENCY: 2x/week  PT DURATION: 8 weeks  PLANNED INTERVENTIONS: Therapeutic exercises, Therapeutic activity, Neuromuscular re-education, Patient/Family education, Self Care, Joint mobilization, Aquatic Therapy, Dry Needling, Spinal mobilization, Cryotherapy, Moist heat, Taping, Traction, Ultrasound, Ionotophoresis 4mg /ml Dexamethasone, Manual therapy, and Re-evaluation  PLAN FOR NEXT SESSION: review and progress postural correction and education as well as HEP; manual work  and/or DN as indicated; modalities as indicated - assess response to DN with estim.  Trial of manual work with end range stretching of shoulder with scapula stabilization for ROM    Deloy Archey P Maritssa Haughton, PT 02/07/2022, 4:05 PM

## 2022-02-08 LAB — COMPLETE METABOLIC PANEL WITH GFR
AG Ratio: 1.8 (calc) (ref 1.0–2.5)
ALT: 14 U/L (ref 9–46)
AST: 17 U/L (ref 10–35)
Albumin: 4.6 g/dL (ref 3.6–5.1)
Alkaline phosphatase (APISO): 57 U/L (ref 35–144)
BUN/Creatinine Ratio: 29 (calc) — ABNORMAL HIGH (ref 6–22)
BUN: 28 mg/dL — ABNORMAL HIGH (ref 7–25)
CO2: 28 mmol/L (ref 20–32)
Calcium: 9.2 mg/dL (ref 8.6–10.3)
Chloride: 103 mmol/L (ref 98–110)
Creat: 0.96 mg/dL (ref 0.70–1.35)
Globulin: 2.6 g/dL (calc) (ref 1.9–3.7)
Glucose, Bld: 81 mg/dL (ref 65–99)
Potassium: 4.3 mmol/L (ref 3.5–5.3)
Sodium: 139 mmol/L (ref 135–146)
Total Bilirubin: 0.4 mg/dL (ref 0.2–1.2)
Total Protein: 7.2 g/dL (ref 6.1–8.1)
eGFR: 90 mL/min/{1.73_m2} (ref >=60)

## 2022-02-08 LAB — LIPID PANEL
Cholesterol: 104 mg/dL (ref <200)
HDL: 36 mg/dL — ABNORMAL LOW (ref >=40)
LDL Cholesterol (Calc): 44 mg/dL (calc)
Non-HDL Cholesterol (Calc): 68 mg/dL (calc) (ref <130)
Total CHOL/HDL Ratio: 2.9 (calc) (ref <5.0)
Triglycerides: 165 mg/dL — ABNORMAL HIGH (ref <150)

## 2022-04-25 ENCOUNTER — Ambulatory Visit: Payer: Managed Care, Other (non HMO) | Admitting: Medical-Surgical

## 2022-07-19 ENCOUNTER — Other Ambulatory Visit: Payer: Self-pay | Admitting: Medical-Surgical

## 2022-07-25 ENCOUNTER — Encounter: Payer: Self-pay | Admitting: Medical-Surgical

## 2022-07-25 ENCOUNTER — Ambulatory Visit: Payer: Managed Care, Other (non HMO) | Admitting: Medical-Surgical

## 2022-07-25 VITALS — BP 108/68 | HR 78 | Resp 20 | Ht 70.0 in | Wt 151.1 lb

## 2022-07-25 DIAGNOSIS — E785 Hyperlipidemia, unspecified: Secondary | ICD-10-CM

## 2022-07-25 DIAGNOSIS — I10 Essential (primary) hypertension: Secondary | ICD-10-CM | POA: Diagnosis not present

## 2022-07-25 DIAGNOSIS — F1320 Sedative, hypnotic or anxiolytic dependence, uncomplicated: Secondary | ICD-10-CM | POA: Diagnosis not present

## 2022-07-25 MED ORDER — LISINOPRIL 5 MG PO TABS: 5.0000 mg | ORAL_TABLET | Freq: Every day | ORAL | 1 refills | Status: DC

## 2022-07-25 MED ORDER — ALPRAZOLAM 0.5 MG PO TABS: 0.5000 mg | ORAL_TABLET | Freq: Two times a day (BID) | ORAL | 1 refills | Status: DC | PRN

## 2022-07-25 NOTE — Progress Notes (Signed)
        Established patient visit  History, exam, impression, and plan:  1. Hyperlipidemia, unspecified hyperlipidemia type Pleasant 62 year old male with a history of hyperlipidemia currently taking Crestor 5mg  daily, tolerating well without side effects. Has made significant changes in his diet. Increased exercise and is now doing weight lifting. Has lost weight with these changes. Is not happy that he is on Crestor and would like to come off the medication to see if his cholesterol is better managed after making these changes. Reviewed recommendation for control of cholesterol to reduce cardiovascular risks. Discussed coronary calcium scoring to help stratify risks. He would like to do this so ordered today. After making multiple changes, I feel it's reasonable to trial coming off Crestor. Discontinue the medication and plan to recheck lipids/CMP in 6-8 weeks.  - CT CARDIAC SCORING (SELF PAY ONLY); Future  2. Essential hypertension Doing well on Lisinopril 5mg  daily. Previously monitored BP at home but his readings were always good so he quit checking it. Would like to get off Lisinopril if at all possible. BP looks great today. Plan to check labs as below in 6-8 weeks. Continue Lisinopril for now but may consider trial off the medication in the near future if readings continue to look good.  - CBC with Differential/Platelet - COMPLETE METABOLIC PANEL WITH GFR - Lipid panel  3. Benzodiazepine dependence (HCC) Unfortunately, he has been reliant on Xanax 0.5mg  twice daily for many years. Takes this medication every day as a scheduled drug rather than as needed. We have discussed recommendations to only use as needed for severe anxiety rather than taking it regularly but he has not made this change yet. He is aware of the risk of tolerance but does not desire to change anything right now. Plan to continue Xanax twice daily as needed but would prefer to avoid taking it regular. Will plan to revisit  recommendations for taper and discontinuation of the Xanax at our next follow up.   Procedures performed this visit: None.  Return in about 6 months (around 01/25/2023) for HTN follow up.  __________________________________ Thayer Ohm, DNP, APRN, FNP-BC Primary Care and Sports Medicine [facility] [place]

## 2022-08-02 ENCOUNTER — Ambulatory Visit (INDEPENDENT_AMBULATORY_CARE_PROVIDER_SITE_OTHER): Payer: Self-pay

## 2022-08-02 DIAGNOSIS — E785 Hyperlipidemia, unspecified: Secondary | ICD-10-CM

## 2022-09-07 ENCOUNTER — Encounter: Payer: Self-pay | Admitting: Medical-Surgical

## 2022-10-03 ENCOUNTER — Other Ambulatory Visit: Payer: Self-pay

## 2022-10-03 DIAGNOSIS — E785 Hyperlipidemia, unspecified: Secondary | ICD-10-CM

## 2022-10-03 DIAGNOSIS — I10 Essential (primary) hypertension: Secondary | ICD-10-CM

## 2022-10-03 NOTE — Progress Notes (Signed)
Switched labs to [facility].

## 2022-10-04 LAB — LIPID PANEL
Chol/HDL Ratio: 3 ratio (ref 0.0–5.0)
Cholesterol, Total: 134 mg/dL (ref 100–199)
HDL: 44 mg/dL (ref >39)
LDL Chol Calc (NIH): 66 mg/dL (ref 0–99)
Triglycerides: 135 mg/dL (ref 0–149)
VLDL Cholesterol Cal: 24 mg/dL (ref 5–40)

## 2022-10-04 LAB — CBC WITH DIFFERENTIAL/PLATELET
Basophils Absolute: 0 10*3/uL (ref 0.0–0.2)
Basos: 1 %
EOS (ABSOLUTE): 0.1 10*3/uL (ref 0.0–0.4)
Eos: 1 %
Hematocrit: 47.4 % (ref 37.5–51.0)
Hemoglobin: 15.2 g/dL (ref 13.0–17.7)
Immature Grans (Abs): 0 10*3/uL (ref 0.0–0.1)
Immature Granulocytes: 0 %
Lymphocytes Absolute: 1.2 10*3/uL (ref 0.7–3.1)
Lymphs: 29 %
MCH: 28.6 pg (ref 26.6–33.0)
MCHC: 32.1 g/dL (ref 31.5–35.7)
MCV: 89 fL (ref 79–97)
Monocytes Absolute: 0.5 10*3/uL (ref 0.1–0.9)
Monocytes: 11 %
Neutrophils Absolute: 2.4 10*3/uL (ref 1.4–7.0)
Neutrophils: 58 %
Platelets: 220 10*3/uL (ref 150–450)
RBC: 5.31 x10E6/uL (ref 4.14–5.80)
RDW: 13.1 % (ref 11.6–15.4)
WBC: 4.1 10*3/uL (ref 3.4–10.8)

## 2022-10-04 LAB — CMP14+EGFR
ALT: 35 IU/L (ref 0–44)
AST: 31 IU/L (ref 0–40)
Albumin: 4.8 g/dL (ref 3.9–4.9)
Alkaline Phosphatase: 94 IU/L (ref 44–121)
BUN/Creatinine Ratio: 19 (ref 10–24)
BUN: 18 mg/dL (ref 8–27)
Bilirubin Total: 0.4 mg/dL (ref 0.0–1.2)
CO2: 27 mmol/L (ref 20–29)
Calcium: 9.4 mg/dL (ref 8.6–10.2)
Chloride: 103 mmol/L (ref 96–106)
Creatinine, Ser: 0.96 mg/dL (ref 0.76–1.27)
Globulin, Total: 2.2 g/dL (ref 1.5–4.5)
Glucose: 88 mg/dL (ref 70–99)
Potassium: 4.3 mmol/L (ref 3.5–5.2)
Sodium: 144 mmol/L (ref 134–144)
Total Protein: 7 g/dL (ref 6.0–8.5)
eGFR: 90 mL/min/{1.73_m2} (ref >59)

## 2022-10-16 ENCOUNTER — Other Ambulatory Visit: Payer: Self-pay | Admitting: Medical-Surgical

## 2023-01-12 ENCOUNTER — Other Ambulatory Visit: Payer: Self-pay | Admitting: Medical-Surgical

## 2023-01-25 ENCOUNTER — Ambulatory Visit (INDEPENDENT_AMBULATORY_CARE_PROVIDER_SITE_OTHER): Payer: Managed Care, Other (non HMO) | Admitting: Medical-Surgical

## 2023-01-25 ENCOUNTER — Encounter: Payer: Self-pay | Admitting: Medical-Surgical

## 2023-01-25 VITALS — BP 117/82 | HR 66 | Resp 20 | Ht 70.0 in | Wt 158.0 lb

## 2023-01-25 DIAGNOSIS — I10 Essential (primary) hypertension: Secondary | ICD-10-CM

## 2023-01-25 DIAGNOSIS — F1320 Sedative, hypnotic or anxiolytic dependence, uncomplicated: Secondary | ICD-10-CM

## 2023-01-25 DIAGNOSIS — E785 Hyperlipidemia, unspecified: Secondary | ICD-10-CM

## 2023-01-25 DIAGNOSIS — F419 Anxiety disorder, unspecified: Secondary | ICD-10-CM

## 2023-01-25 MED ORDER — ALPRAZOLAM 0.5 MG PO TABS: 0.5000 mg | ORAL_TABLET | Freq: Two times a day (BID) | ORAL | 1 refills | Status: DC | PRN

## 2023-01-25 MED ORDER — ROSUVASTATIN CALCIUM 5 MG PO TABS: 5.0000 mg | ORAL_TABLET | Freq: Every day | ORAL | 0 refills | Status: DC

## 2023-01-25 MED ORDER — LISINOPRIL 5 MG PO TABS: 5.0000 mg | ORAL_TABLET | Freq: Every day | ORAL | 1 refills | Status: DC

## 2023-01-25 NOTE — Progress Notes (Signed)
        Established patient visit  History, exam, impression, and plan:  1. Essential hypertension (Primary) Pleasant 63 year old male presenting today for follow-up on hypertension.  He is currently taking lisinopril 5 mg daily, tolerating well without side effects.  Checking blood pressures at home and reports his home readings have been very good.  Following a low-sodium diet.  His activity level has decreased with the cold weather but he still tries to work in exercise when he can.  Denies any concerning symptoms today.  Blood pressure is at goal.  Not due for labs.  Continue lisinopril as prescribed.  2. Anxiety 3. Benzodiazepine dependence (HCC) Continues to use alprazolam 0.5 mg twice daily on a scheduled basis.  Long history of benzodiazepine use with current benzodiazepine dependence.  He tolerates the medication well without excessive sedation or grogginess.  Feels that this works well to keep his mood well-managed.  Aware of risks of using benzodiazepines long-term.  Continue Xanax 0.5 mg twice daily as needed.  4. Hyperlipidemia, unspecified hyperlipidemia type Currently taking rosuvastatin 5 mg daily, tolerating well without side effects.  Lipid panel up-to-date.  Following low-fat heart healthy diet.  Exercise as noted above.  No concerns today.  Continue rosuvastatin as prescribed.  Procedures performed this visit: None.  Return in about 6 months (around 07/25/2023) for HTN/anxiety follow up.  __________________________________ Thayer Ohm, DNP, APRN, FNP-BC Primary Care and Sports Medicine [facility] [place]

## 2023-03-17 IMAGING — DX DG KNEE COMPLETE 4+V*L*
4 series · 4 of 4 positions shown · non-contrast
Comparison: None.

CLINICAL DATA: Left knee pain with intermittent popping.  Chronic.

EXAM:
LEFT KNEE - COMPLETE 4+ VIEW

[knee ap]
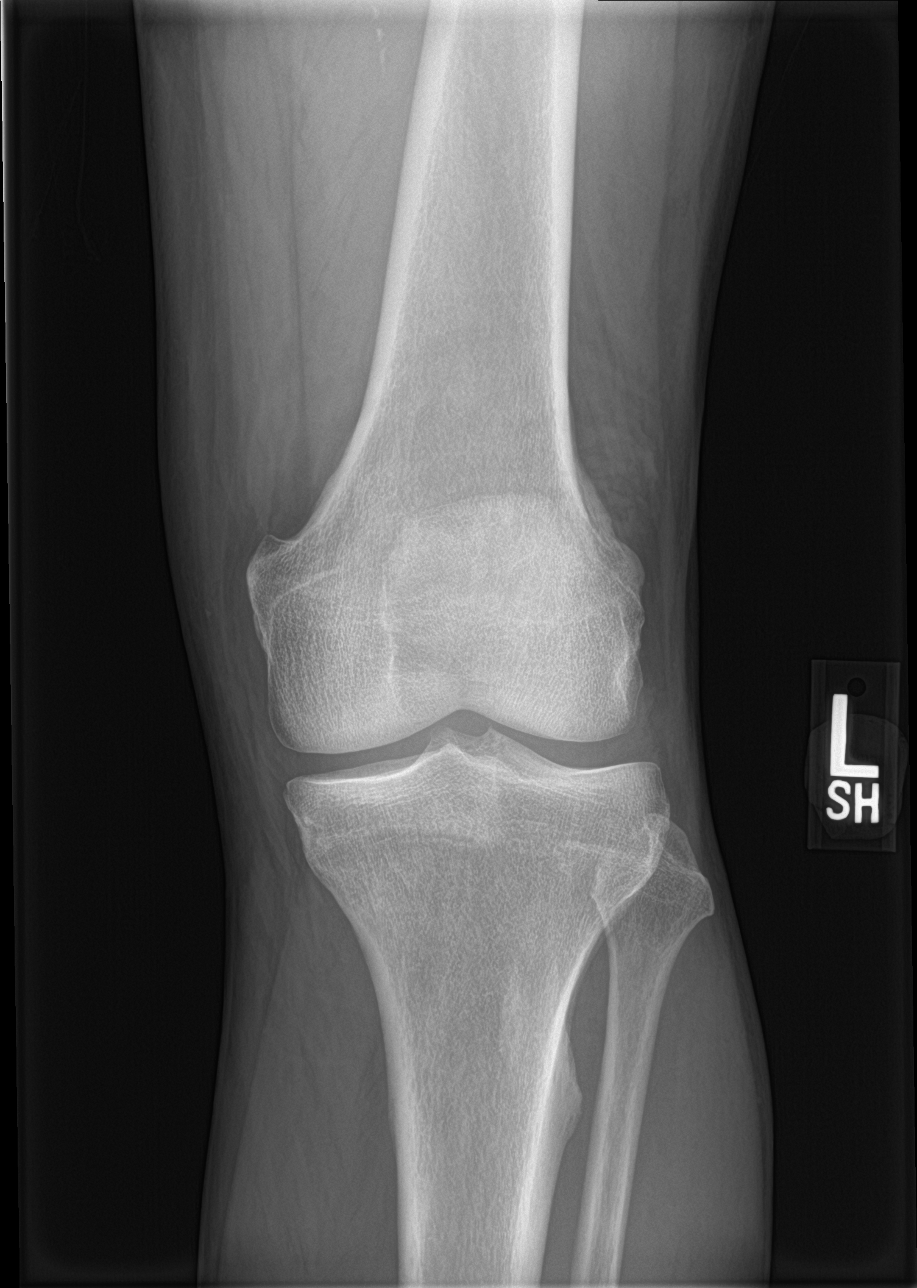

[knee lat]
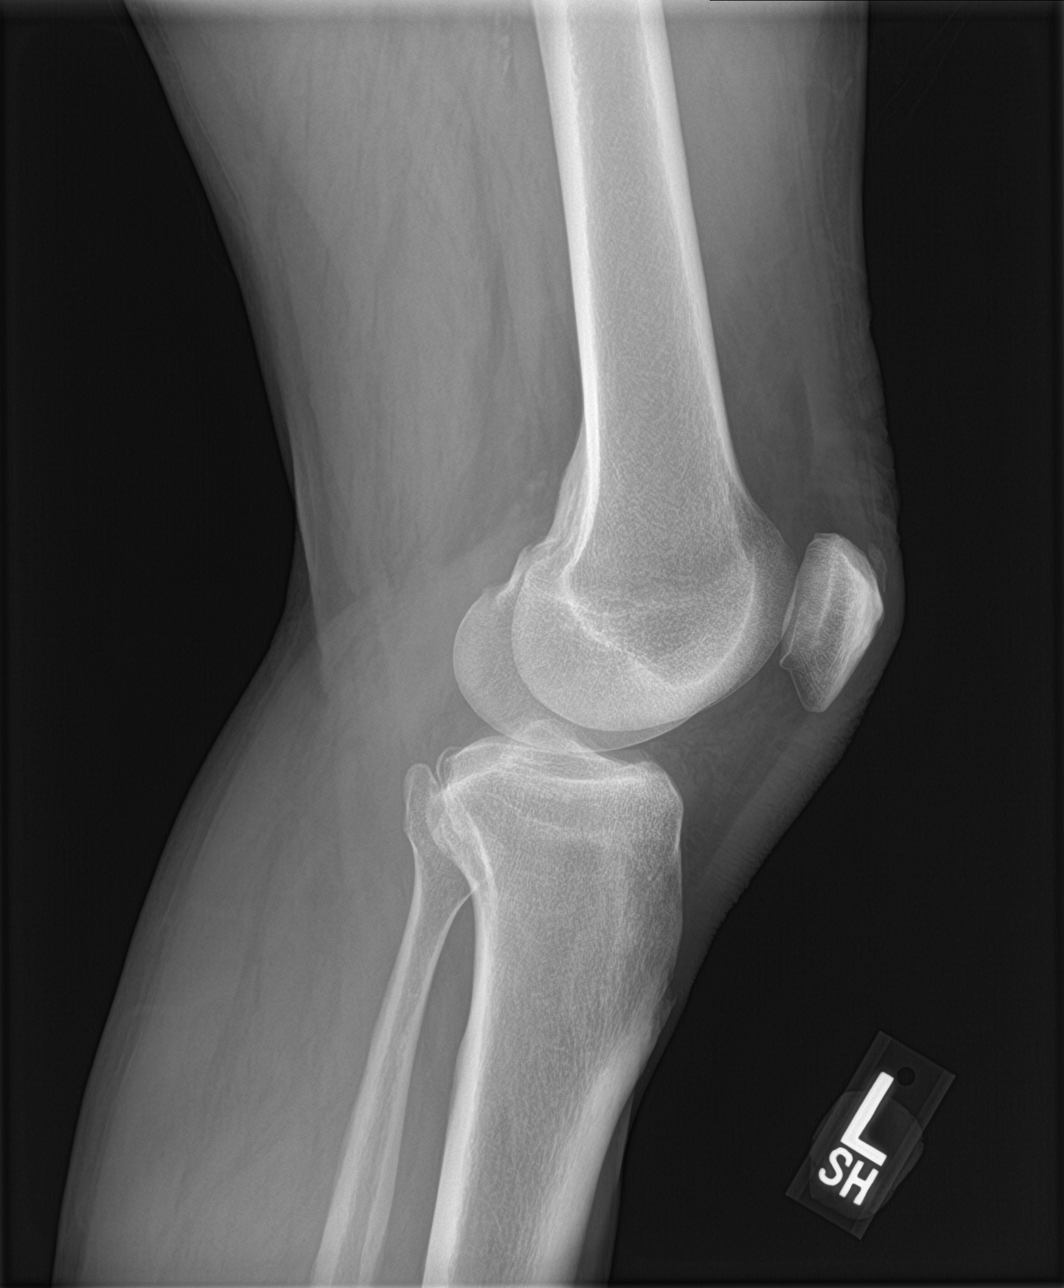

[knee obl (1 of 2)]
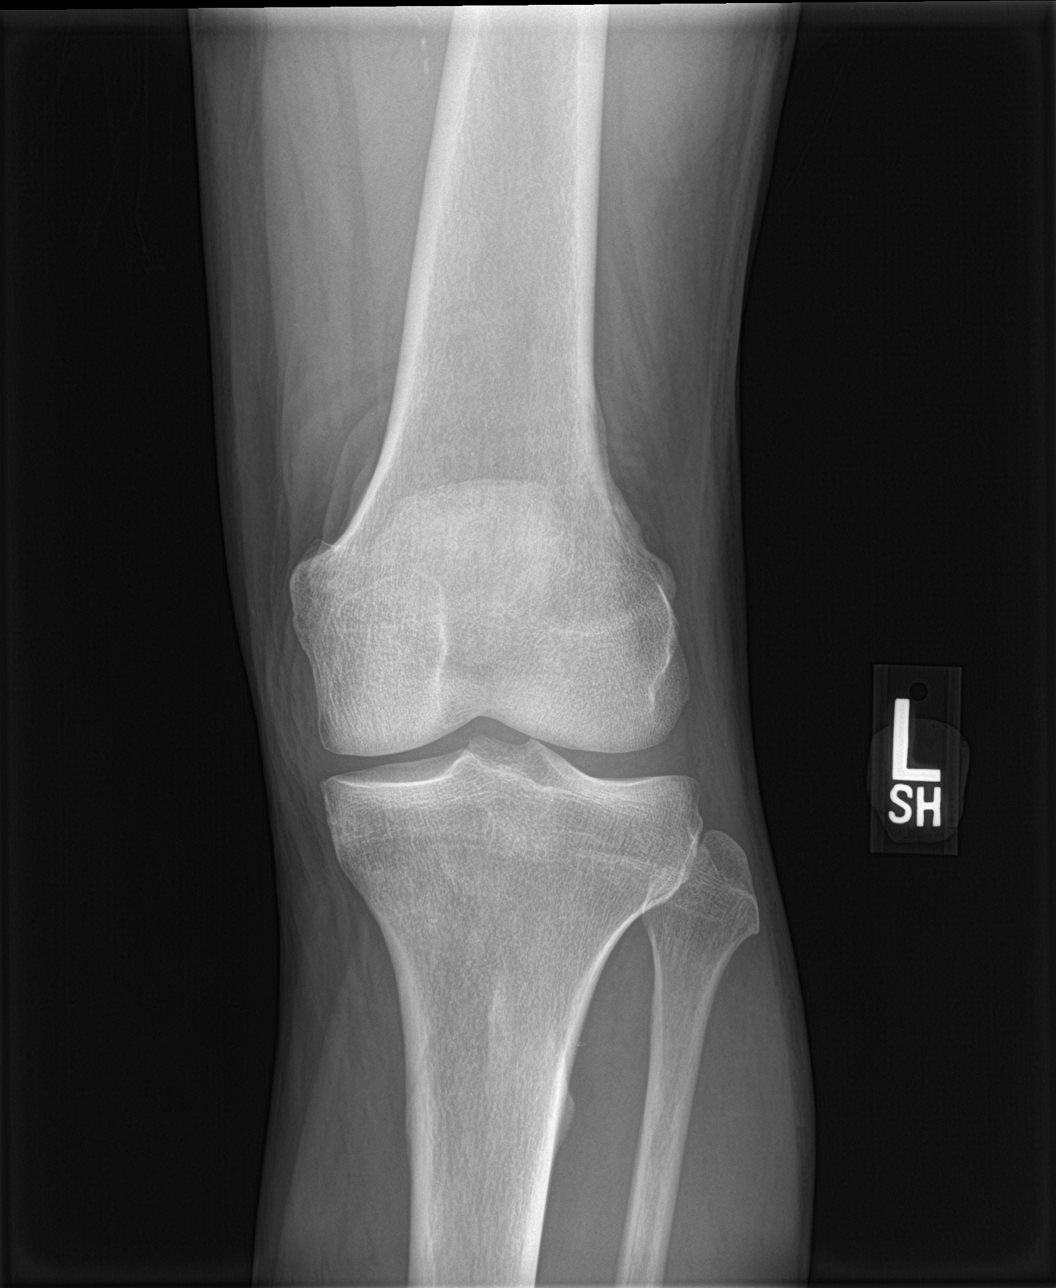

[knee obl (2 of 2)]
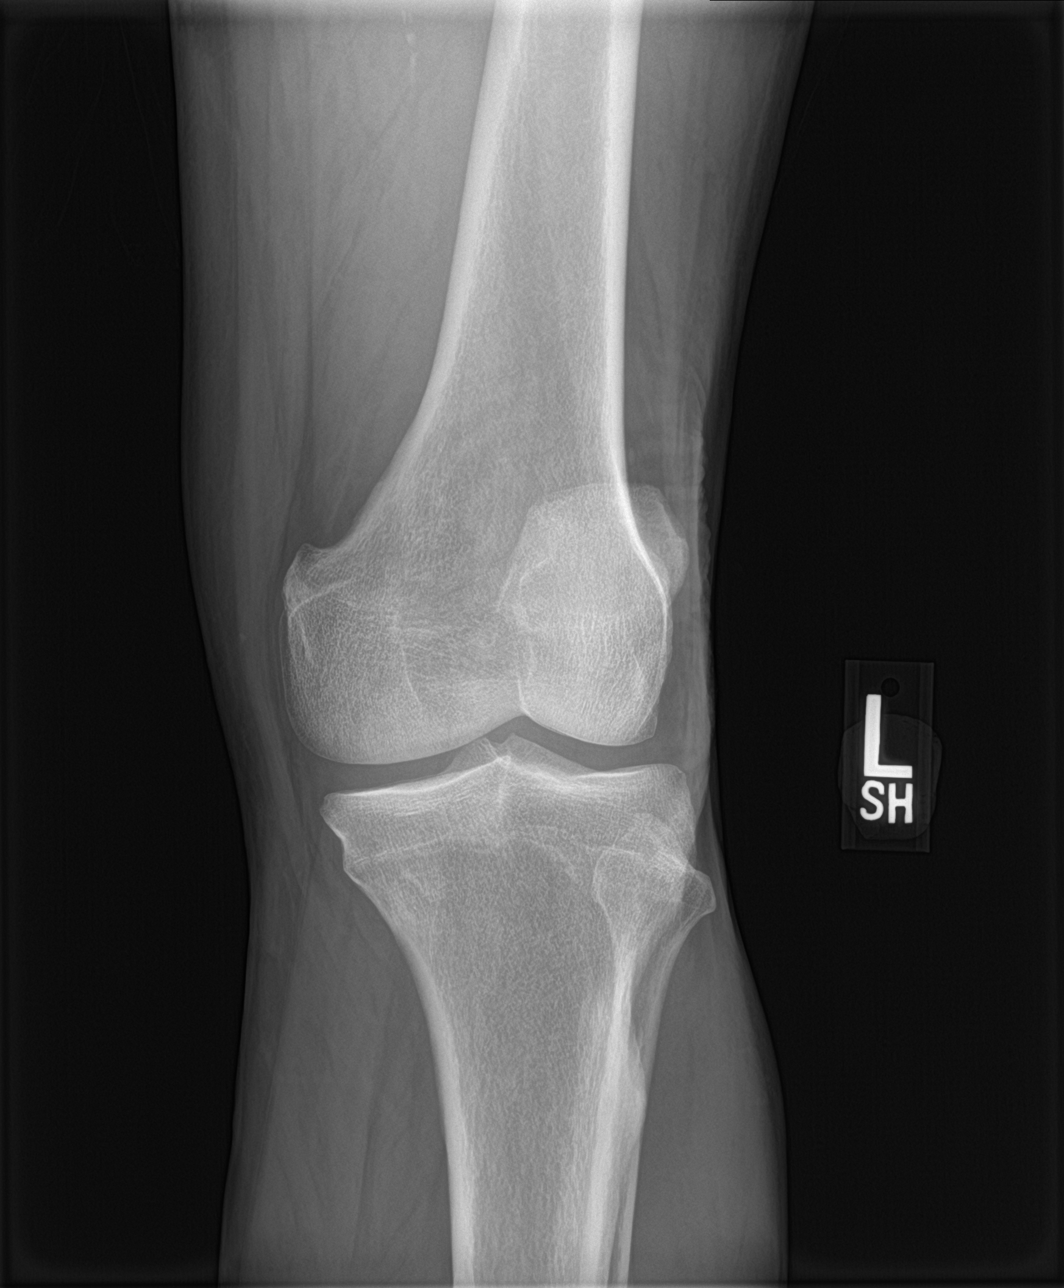

[4 of 4 positions shown; findings below may reference images not displayed]

FINDINGS: Normal bone mineralization. Joint spaces are preserved. No joint
effusion. Minimal enthesopathic change at the quadriceps insertion
on the patella. No acute fracture or dislocation. Vascular
calcifications are noted.
IMPRESSION: Minimal enthesopathic change at the quadriceps insertion on the
patella. No significant degenerative change.

## 2023-07-12 ENCOUNTER — Other Ambulatory Visit: Payer: Self-pay | Admitting: Medical-Surgical

## 2023-07-26 ENCOUNTER — Encounter: Payer: Self-pay | Admitting: Medical-Surgical

## 2023-07-26 ENCOUNTER — Ambulatory Visit: Payer: Managed Care, Other (non HMO) | Admitting: Medical-Surgical

## 2023-07-26 VITALS — BP 107/72 | HR 83 | Resp 20 | Ht 70.0 in | Wt 157.0 lb

## 2023-07-26 DIAGNOSIS — M75111 Incomplete rotator cuff tear or rupture of right shoulder, not specified as traumatic: Secondary | ICD-10-CM

## 2023-07-26 DIAGNOSIS — F419 Anxiety disorder, unspecified: Secondary | ICD-10-CM | POA: Diagnosis not present

## 2023-07-26 DIAGNOSIS — I10 Essential (primary) hypertension: Secondary | ICD-10-CM | POA: Diagnosis not present

## 2023-07-26 DIAGNOSIS — L309 Dermatitis, unspecified: Secondary | ICD-10-CM

## 2023-07-26 DIAGNOSIS — F1320 Sedative, hypnotic or anxiolytic dependence, uncomplicated: Secondary | ICD-10-CM | POA: Diagnosis not present

## 2023-07-26 DIAGNOSIS — E785 Hyperlipidemia, unspecified: Secondary | ICD-10-CM | POA: Insufficient documentation

## 2023-07-26 DIAGNOSIS — R7303 Prediabetes: Secondary | ICD-10-CM | POA: Insufficient documentation

## 2023-07-26 MED ORDER — LISINOPRIL 5 MG PO TABS
5.0000 mg | ORAL_TABLET | Freq: Every day | ORAL | 3 refills | Status: AC
Start: 2023-07-26 — End: ?

## 2023-07-26 MED ORDER — ROSUVASTATIN CALCIUM 5 MG PO TABS
5.0000 mg | ORAL_TABLET | Freq: Every day | ORAL | 3 refills | Status: AC
Start: 2023-07-26 — End: ?

## 2023-07-26 MED ORDER — ALPRAZOLAM 0.5 MG PO TABS: 0.5000 mg | ORAL_TABLET | Freq: Two times a day (BID) | ORAL | 1 refills | Status: DC | PRN

## 2023-07-26 NOTE — Progress Notes (Signed)
        Established patient visit  Discussed the use of AI scribe software for clinical note transcription with the patient, who gave verbal consent to proceed.  History of Present Illness   Scott Hernandez is a 63 year old male who presents for a follow-up visit.  Anxiety - Managed long-term with Xanax  twice daily, which remains effective - Feels 'stressed'  Hypertension and hyperlipidemia - Takes lisinopril  for hypertension and Crestor  for hyperlipidemia - Home blood pressure readings are at goal - Follows a low sodium diet, eliminating added salt and using low sodium alternatives  Prediabetes - Identified as prediabetic in [place] during a county wellness program - Plans to send lab results for medical record  Musculoskeletal injury - Rehabilitating a partial tear in the right rotator cuff with exercises - Significant improvement in symptoms  Eczema - Occasional episodes of eczema - Family history of eczema  Physical activity and weight management - Engages in cardiovascular exercise and strength training - Recently participated in kayaking - [facility]        Physical Exam Vitals and nursing note reviewed.  Constitutional:      General: Scott Hernandez is not in acute distress.    Appearance: Normal appearance. Scott Hernandez is not ill-appearing.  HENT:     Head: Normocephalic and atraumatic.  Cardiovascular:     Rate and Rhythm: Normal rate and regular rhythm.     Pulses: Normal pulses.     Heart sounds: Normal heart sounds. No murmur heard.    No friction rub. No gallop.  Pulmonary:     Effort: Pulmonary effort is normal. No respiratory distress.     Breath sounds: Normal breath sounds.  Skin:    General: Skin is warm and dry.  Neurological:     Mental Status: Scott Hernandez is alert and oriented to person, place, and time.  Psychiatric:        Mood and Affect: Mood normal.        Behavior: Behavior normal.        Thought Content: Thought content  normal.        Judgment: Judgment normal.     Assessment and Plan    Partial tear of the right rotator cuff Partial tear managed with strengthening exercises, improving function and reducing symptoms. - Continue strengthening exercises for shoulder rehabilitation.  Anxiety Anxiety managed with Xanax  twice daily. - Continue Xanax  twice daily.  Hypertension Hypertension managed with lisinopril . Home readings slightly elevated; in-office readings satisfactory. Low sodium diet and exercise beneficial. - Continue lisinopril . - Encourage continued low sodium diet. - Encourage regular exercise.   Hyperlipidemia - Labs in April 2025 through work health program show well controlled lipids - Checking liver function - Continue Crestor   Prediabetes Prediabetes managed with wellness program and lifestyle modifications, improving weight and lab results. - Encourage continued participation in wellness program.  Eczema Intermittent eczema, no acute issues reported.  General Health Maintenance Engaged in health maintenance activities. Recent labs reviewed; kidney function not checked since September. - Send recent lab results via MyChart for record update. - Check kidney function today.  Follow-up Advised follow-up in six months, aware of MyChart for concerns. - Schedule follow-up appointment in six months. - Use MyChart for any interim concerns.      Return in about 6 months (around 01/26/2024) for HTN/anxiety follow up.  __________________________________ Zada FREDRIK Palin, DNP, APRN, FNP-BC Primary Care and Sports Medicine [facility] [place]

## 2023-07-27 ENCOUNTER — Ambulatory Visit: Payer: Self-pay | Admitting: Medical-Surgical

## 2023-07-27 LAB — CBC
Hematocrit: 44.1 % (ref 37.5–51.0)
Hemoglobin: 14.8 g/dL (ref 13.0–17.7)
MCH: 28.7 pg (ref 26.6–33.0)
MCHC: 33.6 g/dL (ref 31.5–35.7)
MCV: 86 fL (ref 79–97)
Platelets: 195 x10E3/uL (ref 150–450)
RBC: 5.15 x10E6/uL (ref 4.14–5.80)
RDW: 12.9 % (ref 11.6–15.4)
WBC: 5.2 x10E3/uL (ref 3.4–10.8)

## 2023-07-27 LAB — CMP14+EGFR
ALT: 14 IU/L (ref 0–44)
AST: 18 IU/L (ref 0–40)
Albumin: 4.8 g/dL (ref 3.9–4.9)
Alkaline Phosphatase: 75 IU/L (ref 44–121)
BUN/Creatinine Ratio: 19 (ref 10–24)
BUN: 21 mg/dL (ref 8–27)
Bilirubin Total: 0.6 mg/dL (ref 0.0–1.2)
CO2: 22 mmol/L (ref 20–29)
Calcium: 9.5 mg/dL (ref 8.6–10.2)
Chloride: 101 mmol/L (ref 96–106)
Creatinine, Ser: 1.08 mg/dL (ref 0.76–1.27)
Globulin, Total: 2.2 g/dL (ref 1.5–4.5)
Glucose: 86 mg/dL (ref 70–99)
Potassium: 4.4 mmol/L (ref 3.5–5.2)
Sodium: 138 mmol/L (ref 134–144)
Total Protein: 7 g/dL (ref 6.0–8.5)
eGFR: 78 mL/min/1.73 (ref >59)

## 2023-07-27 NOTE — Telephone Encounter (Signed)
Printed to be abstracted.

## 2023-09-11 ENCOUNTER — Encounter: Payer: Self-pay | Admitting: Sports Medicine

## 2024-01-25 ENCOUNTER — Ambulatory Visit: Admitting: Medical-Surgical

## 2024-01-25 ENCOUNTER — Other Ambulatory Visit: Payer: Self-pay

## 2024-01-25 DIAGNOSIS — F1320 Sedative, hypnotic or anxiolytic dependence, uncomplicated: Secondary | ICD-10-CM

## 2024-01-25 DIAGNOSIS — F419 Anxiety disorder, unspecified: Secondary | ICD-10-CM

## 2024-01-25 MED ORDER — ALPRAZOLAM 0.5 MG PO TABS: 0.5000 mg | ORAL_TABLET | Freq: Two times a day (BID) | ORAL | 1 refills | Status: AC | PRN

## 2024-01-25 NOTE — Addendum Note (Signed)
 Addended by: FANNY NIELS CROME on: 01/25/2024 08:57 AM   Modules accepted: Orders

## 2024-01-25 NOTE — Addendum Note (Signed)
 Addended byBETHA WILLO MINI on: 01/25/2024 03:16 PM   Modules accepted: Orders

## 2024-01-25 NOTE — Telephone Encounter (Signed)
 patient rescheduled to 1/21, patient needs refill on ALPRAZolam  (XANAX ) 0.5 MG tablet. He also has a procedure on Monday 1/19 for cancer, thanks.

## 2024-01-30 ENCOUNTER — Encounter: Payer: Self-pay | Admitting: Medical-Surgical

## 2024-01-30 ENCOUNTER — Ambulatory Visit: Admitting: Medical-Surgical

## 2024-01-30 VITALS — BP 106/72 | HR 70 | Temp 97.5°F | Resp 16 | Ht 69.0 in | Wt 167.1 lb

## 2024-01-30 DIAGNOSIS — Z23 Encounter for immunization: Secondary | ICD-10-CM | POA: Diagnosis not present

## 2024-01-30 DIAGNOSIS — F1320 Sedative, hypnotic or anxiolytic dependence, uncomplicated: Secondary | ICD-10-CM

## 2024-01-30 DIAGNOSIS — I10 Essential (primary) hypertension: Secondary | ICD-10-CM

## 2024-01-30 DIAGNOSIS — Z125 Encounter for screening for malignant neoplasm of prostate: Secondary | ICD-10-CM | POA: Diagnosis not present

## 2024-01-30 DIAGNOSIS — R058 Other specified cough: Secondary | ICD-10-CM

## 2024-01-30 DIAGNOSIS — F419 Anxiety disorder, unspecified: Secondary | ICD-10-CM

## 2024-01-30 MED ORDER — BENZONATATE 200 MG PO CAPS: 200.0000 mg | ORAL_CAPSULE | Freq: Three times a day (TID) | ORAL | 0 refills | Status: AC | PRN

## 2024-01-30 NOTE — Progress Notes (Signed)
" ° °       Established patient visit   History of Present Illness   Discussed the use of AI scribe software for clinical note transcription with the patient, who gave verbal consent to proceed.  History of Present Illness   Scott Hernandez is a 64 year old male who presents with a persistent dry cough following a recent flu infection.  Cough - Persistent dry cough developed after influenza infection contracted the day after New Year's Day - Cough occurs mainly during the day - Cough does not wake him at night - Cough is described as exhausting - Steam inhalation has provided little relief - Previously had good relief with Tessalon  Perles - Not currently using any cough medication  Anxiety/benzodiazepine dependence - Takes Xanax  twice daily without side effects, effective  Hypertension - Takes lisinopril  5 mg daily with good blood pressure control, well tolerated - Has stopped home blood pressure checks after consistently favorable readings   Physical Exam   Physical Exam Vitals and nursing note reviewed.  Constitutional:      General: He is not in acute distress.    Appearance: Normal appearance. He is not ill-appearing.  HENT:     Head: Normocephalic and atraumatic.  Cardiovascular:     Rate and Rhythm: Normal rate and regular rhythm.     Pulses: Normal pulses.     Heart sounds: Normal heart sounds. No murmur heard.    No friction rub. No gallop.  Pulmonary:     Effort: Pulmonary effort is normal. No respiratory distress.     Breath sounds: Normal breath sounds.  Skin:    General: Skin is warm and dry.  Neurological:     Mental Status: He is alert and oriented to person, place, and time.  Psychiatric:        Mood and Affect: Mood normal.        Behavior: Behavior normal.        Thought Content: Thought content normal.        Judgment: Judgment normal.    Assessment & Plan   Post-viral cough syndrome Persistent dry cough post-influenza, exacerbated by deep  breaths. - Prescribed Tessalon  Perles every 8 hours as needed for cough.  Essential hypertension Blood pressure controlled with lisinopril  5 mg daily. Today's reading 106/72 mmHg. - Continue lisinopril  5 mg daily. - Recommend occasional home monitoring with a goal of less than 130/80. - Updating labs today.   Anxiety/benzodiazepine dependence Long-term use of Xanax  twice daily for management of anxiety with development of benzodiazepine dependence.  PDMP reviewed, no red flags or concerning findings. -Continue Xanax  0.5 mg twice daily as needed.  Prostate cancer screening PSA screening due. - Ordered PSA test.   Immunization due -Prevnar 20 given in office today.    Follow up   Return in about 6 months (around 07/29/2024) for HTN/Anxiety follow up. __________________________________ Scott FREDRIK Palin, DNP, APRN, FNP-BC Primary Care and Sports Medicine [facility] "

## 2024-01-31 ENCOUNTER — Ambulatory Visit: Payer: Self-pay | Admitting: Medical-Surgical

## 2024-01-31 LAB — COMPREHENSIVE METABOLIC PANEL WITH GFR
ALT: 29 IU/L (ref 0–44)
AST: 22 IU/L (ref 0–40)
Albumin: 4.5 g/dL (ref 3.9–4.9)
Alkaline Phosphatase: 65 IU/L (ref 47–123)
BUN/Creatinine Ratio: 21 (ref 10–24)
BUN: 21 mg/dL (ref 8–27)
Bilirubin Total: 0.4 mg/dL (ref 0.0–1.2)
CO2: 21 mmol/L (ref 20–29)
Calcium: 9.4 mg/dL (ref 8.6–10.2)
Chloride: 102 mmol/L (ref 96–106)
Creatinine, Ser: 0.98 mg/dL (ref 0.76–1.27)
Globulin, Total: 2.4 g/dL (ref 1.5–4.5)
Glucose: 96 mg/dL (ref 70–99)
Potassium: 4.1 mmol/L (ref 3.5–5.2)
Sodium: 141 mmol/L (ref 134–144)
Total Protein: 6.9 g/dL (ref 6.0–8.5)
eGFR: 87 mL/min/1.73

## 2024-01-31 LAB — CBC WITH DIFFERENTIAL/PLATELET
Basophils Absolute: 0 x10E3/uL (ref 0.0–0.2)
Basos: 1 %
EOS (ABSOLUTE): 0.1 x10E3/uL (ref 0.0–0.4)
Eos: 1 %
Hematocrit: 46.7 % (ref 37.5–51.0)
Hemoglobin: 15.5 g/dL (ref 13.0–17.7)
Immature Grans (Abs): 0 x10E3/uL (ref 0.0–0.1)
Immature Granulocytes: 0 %
Lymphocytes Absolute: 1.3 x10E3/uL (ref 0.7–3.1)
Lymphs: 30 %
MCH: 28.8 pg (ref 26.6–33.0)
MCHC: 33.2 g/dL (ref 31.5–35.7)
MCV: 87 fL (ref 79–97)
Monocytes Absolute: 0.3 x10E3/uL (ref 0.1–0.9)
Monocytes: 8 %
Neutrophils Absolute: 2.7 x10E3/uL (ref 1.4–7.0)
Neutrophils: 60 %
Platelets: 267 x10E3/uL (ref 150–450)
RBC: 5.38 x10E6/uL (ref 4.14–5.80)
RDW: 12.8 % (ref 11.6–15.4)
WBC: 4.5 x10E3/uL (ref 3.4–10.8)

## 2024-01-31 LAB — LIPID PANEL
Chol/HDL Ratio: 2.5 ratio (ref 0.0–5.0)
Cholesterol, Total: 98 mg/dL — ABNORMAL LOW (ref 100–199)
HDL: 40 mg/dL
LDL Chol Calc (NIH): 35 mg/dL (ref 0–99)
Triglycerides: 129 mg/dL (ref 0–149)
VLDL Cholesterol Cal: 23 mg/dL (ref 5–40)

## 2024-01-31 LAB — PSA TOTAL (REFLEX TO FREE): Prostate Specific Ag, Serum: 0.9 ng/mL (ref 0.0–4.0)

## 2024-07-31 ENCOUNTER — Ambulatory Visit: Admitting: Medical-Surgical
